# Patient Record
Sex: Female | Born: 1985 | Race: Black or African American | Hispanic: No | Marital: Single | Smoking: Former smoker
Health system: Southern US, Community
[De-identification: ages and names within clinical notes are randomized; demographics above are authoritative.]

## PROBLEM LIST (undated history)

## (undated) DIAGNOSIS — Z789 Other specified health status: Secondary | ICD-10-CM

## (undated) DIAGNOSIS — I82409 Acute embolism and thrombosis of unspecified deep veins of unspecified lower extremity: Secondary | ICD-10-CM

## (undated) HISTORY — PX: NO PAST SURGERIES: SHX2092

---

## 2005-01-25 ENCOUNTER — Emergency Department (HOSPITAL_COMMUNITY): Admission: EM | Admit: 2005-01-25 | Discharge: 2005-01-25 | Payer: Self-pay | Admitting: Emergency Medicine

## 2008-11-14 ENCOUNTER — Emergency Department (HOSPITAL_BASED_OUTPATIENT_CLINIC_OR_DEPARTMENT_OTHER): Admission: EM | Admit: 2008-11-14 | Discharge: 2008-11-14 | Payer: Self-pay | Admitting: Emergency Medicine

## 2011-01-13 ENCOUNTER — Emergency Department (HOSPITAL_BASED_OUTPATIENT_CLINIC_OR_DEPARTMENT_OTHER)
Admission: EM | Admit: 2011-01-13 | Discharge: 2011-01-13 | Disposition: A | Discharge status: Home / Self Care | Payer: Self-pay | Attending: Emergency Medicine | Admitting: Emergency Medicine

## 2011-01-13 ENCOUNTER — Encounter: Payer: Self-pay | Admitting: Emergency Medicine

## 2011-01-13 ENCOUNTER — Emergency Department (INDEPENDENT_AMBULATORY_CARE_PROVIDER_SITE_OTHER): Payer: Self-pay

## 2011-01-13 DIAGNOSIS — X58XXXA Exposure to other specified factors, initial encounter: Secondary | ICD-10-CM | POA: Insufficient documentation

## 2011-01-13 DIAGNOSIS — S76019A Strain of muscle, fascia and tendon of unspecified hip, initial encounter: Secondary | ICD-10-CM

## 2011-01-13 DIAGNOSIS — F17200 Nicotine dependence, unspecified, uncomplicated: Secondary | ICD-10-CM | POA: Insufficient documentation

## 2011-01-13 DIAGNOSIS — IMO0002 Reserved for concepts with insufficient information to code with codable children: Secondary | ICD-10-CM | POA: Insufficient documentation

## 2011-01-13 DIAGNOSIS — M25559 Pain in unspecified hip: Secondary | ICD-10-CM

## 2011-01-13 DIAGNOSIS — Y92009 Unspecified place in unspecified non-institutional (private) residence as the place of occurrence of the external cause: Secondary | ICD-10-CM | POA: Insufficient documentation

## 2011-01-13 MED ORDER — OXYCODONE-ACETAMINOPHEN 5-325 MG PO TABS
2.0000 | ORAL_TABLET | Freq: Once | ORAL | Status: AC
  Administered 2011-01-13: 2 via ORAL
  Filled 2011-01-13: qty 2

## 2011-01-13 NOTE — ED Provider Notes (Signed)
History     Chief Complaint  Patient presents with  . Leg Injury   HPI Comments: PT was turning in bed, felt "pop" to right hip  Patient is a 25 y.o. female presenting with hip pain. The history is provided by the patient.  Hip Pain This is a new problem. The current episode started yesterday. The problem occurs constantly. The problem has been gradually worsening. Pertinent negatives include no abdominal pain. The symptoms are aggravated by twisting. The symptoms are relieved by nothing. She has tried nothing for the symptoms.    History reviewed. No pertinent past medical history.  History reviewed. No pertinent past surgical history.  History reviewed. No pertinent family history.  History  Substance Use Topics  . Smoking status: Current Some Day Smoker  . Smokeless tobacco: Not on file  . Alcohol Use: Yes     socially    OB History    Grav Para Term Preterm Abortions TAB SAB Ect Mult Living                  Review of Systems  Constitutional: Negative for fever.  Gastrointestinal: Negative for nausea, vomiting and abdominal pain.  Musculoskeletal: Negative for back pain, joint swelling and gait problem.  Skin: Negative.     Physical Exam  BP 132/75  Pulse 87  Temp(Src) 98.6 F (37 C) (Oral)  Resp 18  SpO2 100%  LMP 12/20/2010  Physical Exam  Constitutional: She is oriented to person, place, and time. She appears well-developed and well-nourished.  HENT:  Head: Normocephalic.  Eyes: Pupils are equal, round, and reactive to light.  Neck: Neck supple.  Cardiovascular: Normal rate and regular rhythm.   Pulmonary/Chest: Effort normal and breath sounds normal.  Musculoskeletal:       Tenderness on ROM right leg at hip.  No bony tenderness, extending to anterior thigh.  No swelling or deformity noted.  NV intact  Neurological: She is alert and oriented to person, place, and time.    ED Course  Procedures  MDM X-rays negative.  Will tx symptoms, refer to  ortho if no better      Rolan Bucco, MD 01/13/11 1538

## 2011-01-13 NOTE — ED Notes (Signed)
Right thigh injury occurred last night while moving in bed.  Pt states she felt something pop in thigh.  Pt states she is unable to walk on her leg.  Good PMS.

## 2014-07-24 ENCOUNTER — Encounter (HOSPITAL_BASED_OUTPATIENT_CLINIC_OR_DEPARTMENT_OTHER): Payer: Self-pay | Admitting: Emergency Medicine

## 2014-07-24 ENCOUNTER — Emergency Department (HOSPITAL_BASED_OUTPATIENT_CLINIC_OR_DEPARTMENT_OTHER)
Admission: EM | Admit: 2014-07-24 | Discharge: 2014-07-24 | Disposition: A | Discharge status: Home / Self Care | Payer: Self-pay | Attending: Emergency Medicine | Admitting: Emergency Medicine

## 2014-07-24 DIAGNOSIS — A5901 Trichomonal vulvovaginitis: Secondary | ICD-10-CM | POA: Insufficient documentation

## 2014-07-24 DIAGNOSIS — Z720 Tobacco use: Secondary | ICD-10-CM | POA: Insufficient documentation

## 2014-07-24 DIAGNOSIS — Z3202 Encounter for pregnancy test, result negative: Secondary | ICD-10-CM | POA: Insufficient documentation

## 2014-07-24 LAB — URINALYSIS, ROUTINE W REFLEX MICROSCOPIC
BILIRUBIN URINE: NEGATIVE
Glucose, UA: NEGATIVE mg/dL
HGB URINE DIPSTICK: NEGATIVE
KETONES UR: 15 mg/dL — AB
NITRITE: NEGATIVE
Protein, ur: NEGATIVE mg/dL
Specific Gravity, Urine: 1.023 (ref 1.005–1.030)
UROBILINOGEN UA: 1 mg/dL (ref 0.0–1.0)
pH: 6 (ref 5.0–8.0)

## 2014-07-24 LAB — URINE MICROSCOPIC-ADD ON

## 2014-07-24 LAB — WET PREP, GENITAL: YEAST WET PREP: NONE SEEN

## 2014-07-24 LAB — PREGNANCY, URINE: Preg Test, Ur: NEGATIVE

## 2014-07-24 MED ORDER — AZITHROMYCIN 250 MG PO TABS
1000.0000 mg | ORAL_TABLET | Freq: Once | ORAL | Status: AC
  Administered 2014-07-24: 1000 mg via ORAL
  Filled 2014-07-24: qty 4

## 2014-07-24 MED ORDER — METRONIDAZOLE 500 MG PO TABS
2000.0000 mg | ORAL_TABLET | Freq: Once | ORAL | Status: AC
  Administered 2014-07-24: 2000 mg via ORAL
  Filled 2014-07-24: qty 4

## 2014-07-24 MED ORDER — CEFTRIAXONE SODIUM 250 MG IJ SOLR
250.0000 mg | Freq: Once | INTRAMUSCULAR | Status: AC
  Administered 2014-07-24: 250 mg via INTRAMUSCULAR
  Filled 2014-07-24: qty 250

## 2014-07-24 MED ORDER — LIDOCAINE HCL 2 % IJ SOLN
5.0000 mL | Freq: Once | INTRAMUSCULAR | Status: AC
  Administered 2014-07-24: 2 mL
  Filled 2014-07-24: qty 20

## 2014-07-24 NOTE — ED Notes (Signed)
Pelvic cart at Pts room 

## 2014-07-24 NOTE — Discharge Instructions (Signed)
Trichomoniasis °Trichomoniasis is an infection caused by an organism called Trichomonas. The infection can affect both women and men. In women, the outer female genitalia and the vagina are affected. In men, the penis is mainly affected, but the prostate and other reproductive organs can also be involved. Trichomoniasis is a sexually transmitted infection (STI) and is most often passed to another person through sexual contact.  °RISK FACTORS °· Having unprotected sexual intercourse. °· Having sexual intercourse with an infected partner. °SIGNS AND SYMPTOMS  °Symptoms of trichomoniasis in women include: °· Abnormal gray-green frothy vaginal discharge. °· Itching and irritation of the vagina. °· Itching and irritation of the area outside the vagina. °Symptoms of trichomoniasis in men include:  °· Penile discharge with or without pain. °· Pain during urination. This results from inflammation of the urethra. °DIAGNOSIS  °Trichomoniasis may be found during a Pap test or physical exam. Your health care provider may use one of the following methods to help diagnose this infection: °· Examining vaginal discharge under a microscope. For men, urethral discharge would be examined. °· Testing the pH of the vagina with a test tape. °· Using a vaginal swab test that checks for the Trichomonas organism. A test is available that provides results within a few minutes. °· Doing a culture test for the organism. This is not usually needed. °TREATMENT  °· You may be given medicine to fight the infection. Women should inform their health care provider if they could be or are pregnant. Some medicines used to treat the infection should not be taken during pregnancy. °· Your health care provider may recommend over-the-counter medicines or creams to decrease itching or irritation. °· Your sexual partner will need to be treated if infected. °HOME CARE INSTRUCTIONS  °· Take medicines only as directed by your health care provider. °· Take  over-the-counter medicine for itching or irritation as directed by your health care provider. °· Do not have sexual intercourse while you have the infection. °· Women should not douche or wear tampons while they have the infection. °· Discuss your infection with your partner. Your partner may have gotten the infection from you, or you may have gotten it from your partner. °· Have your sex partner get examined and treated if necessary. °· Practice safe, informed, and protected sex. °· See your health care provider for other STI testing. °SEEK MEDICAL CARE IF:  °· You still have symptoms after you finish your medicine. °· You develop abdominal pain. °· You have pain when you urinate. °· You have bleeding after sexual intercourse. °· You develop a rash. °· Your medicine makes you sick or makes you throw up (vomit). °MAKE SURE YOU: °· Understand these instructions. °· Will watch your condition. °· Will get help right away if you are not doing well or get worse. °Document Released: 11/28/2000 Document Revised: 10/19/2013 Document Reviewed: 03/16/2013 °ExitCare® Patient Information ©2015 [employer]. This information is not intended to replace advice given to you by your health care provider. Make sure you discuss any questions you have with your health care provider. ° °Sexually Transmitted Disease °A sexually transmitted disease (STD) is a disease or infection that may be passed (transmitted) from person to person, usually during sexual activity. This may happen by way of saliva, semen, blood, vaginal mucus, or urine. Common STDs include:  °· Gonorrhea.   °· Chlamydia.   °· Syphilis.   °· HIV and AIDS.   °· Genital herpes.   °· Hepatitis B and C.   °· Trichomonas.   °· Human papillomavirus (  HPV).   °· Pubic lice.   °· Scabies. °· Mites. °· Bacterial vaginosis. °WHAT ARE CAUSES OF STDs? °An STD may be caused by bacteria, a virus, or parasites. STDs are often transmitted during sexual activity if one person is infected.  However, they may also be transmitted through nonsexual means. STDs may be transmitted after:  °· Sexual intercourse with an infected person.   °· Sharing sex toys with an infected person.   °· Sharing needles with an infected person or using unclean piercing or tattoo needles. °· Having intimate contact with the genitals, mouth, or rectal areas of an infected person.   °· Exposure to infected fluids during birth. °WHAT ARE THE SIGNS AND SYMPTOMS OF STDs? °Different STDs have different symptoms. Some people may not have any symptoms. If symptoms are present, they may include:  °· Painful or bloody urination.   °· Pain in the pelvis, abdomen, vagina, anus, throat, or eyes.   °· A skin rash, itching, or irritation. °· Growths, ulcerations, blisters, or sores in the genital and anal areas. °· Abnormal vaginal discharge with or without bad odor.   °· Penile discharge in men.   °· Fever.   °· Pain or bleeding during sexual intercourse.   °· Swollen glands in the groin area.   °· Yellow skin and eyes (jaundice). This is seen with hepatitis.   °· Swollen testicles. °· Infertility. °· Sores and blisters in the mouth. °HOW ARE STDs DIAGNOSED? °To make a diagnosis, your health care provider may:  °· Take a medical history.   °· Perform a physical exam.   °· Take a sample of any discharge to examine. °· Swab the throat, cervix, opening to the penis, rectum, or vagina for testing. °· Test a sample of your first morning urine.   °· Perform blood tests.   °· Perform a Pap test, if this applies.   °· Perform a colposcopy.   °· Perform a laparoscopy.   °HOW ARE STDs TREATED? ° Treatment depends on the STD. Some STDs may be treated but not cured.  °· Chlamydia, gonorrhea, trichomonas, and syphilis can be cured with antibiotic medicine.   °· Genital herpes, hepatitis, and HIV can be treated, but not cured, with prescribed medicines. The medicines lessen symptoms.   °· Genital warts from HPV can be treated with medicine or by  freezing, burning (electrocautery), or surgery. Warts may come back.   °· HPV cannot be cured with medicine or surgery. However, abnormal areas may be removed from the cervix, vagina, or vulva.   °· If your diagnosis is confirmed, your recent sexual partners need treatment. This is true even if they are symptom-free or have a negative culture or evaluation. They should not have sex until their health care providers say it is okay. °HOW CAN I REDUCE MY RISK OF GETTING AN STD? °Take these steps to reduce your risk of getting an STD: °· Use latex condoms, dental dams, and water-soluble lubricants during sexual activity. Do not use petroleum jelly or oils. °· Avoid having multiple sex partners. °· Do not have sex with someone who has other sex partners. °· Do not have sex with anyone you do not know or who is at high risk for an STD. °· Avoid risky sex practices that can break your skin. °· Do not have sex if you have open sores on your mouth or skin. °· Avoid drinking too much alcohol or taking illegal drugs. Alcohol and drugs can affect your judgment and put you in a vulnerable position. °· Avoid engaging in oral and anal sex acts. °· Get vaccinated for HPV and hepatitis. If you   have not received these vaccines in the past, talk to your health care provider about whether one or both might be right for you.   °· If you are at risk of being infected with HIV, it is recommended that you take a prescription medicine daily to prevent HIV infection. This is called pre-exposure prophylaxis (PrEP). You are considered at risk if: °¨ You are a man who has sex with other men (MSM). °¨ You are a heterosexual man or woman and are sexually active with more than one partner. °¨ You take drugs by injection. °¨ You are sexually active with a partner who has HIV. °· Talk with your health care provider about whether you are at high risk of being infected with HIV. If you choose to begin PrEP, you should first be tested for HIV. You  should then be tested every 3 months for as long as you are taking PrEP.   °WHAT SHOULD I DO IF I THINK I HAVE AN STD? °· See your health care provider.   °· Tell your sexual partner(s). They should be tested and treated for any STDs. °· Do not have sex until your health care provider says it is okay.  °WHEN SHOULD I GET IMMEDIATE MEDICAL CARE? °Contact your health care provider right away if:  °· You have severe abdominal pain. °· You are a man and notice swelling or pain in your testicles. °· You are a woman and notice swelling or pain in your vagina. °Document Released: 08/25/2002 Document Revised: 06/09/2013 Document Reviewed: 12/23/2012 °ExitCare® Patient Information ©2015 [employer]. This information is not intended to replace advice given to you by your health care provider. Make sure you discuss any questions you have with your health care provider. ° °

## 2014-07-24 NOTE — ED Notes (Signed)
Pt reports vaginal irritation since wed, denies discharge, found out partner was sleeping with other people

## 2014-07-24 NOTE — ED Provider Notes (Signed)
TIME SEEN: 2:05 AM  CHIEF COMPLAINT: vaginal irritation  HPI: Pt is a 29 y.o. G0 P0 who presents to the emergency department with complaints of vaginal irritation with the past 2 days. Denies discharge or bleeding. States her last menstrual period was the third week of January. No dysuria or hematuria. Is sexually active with one partner but did recently find out that he cheated on her. Denies a prior history of STDs.  ROS: See HPI Constitutional: no fever  Eyes: no drainage  ENT: no runny nose   Cardiovascular:  no chest pain  Resp: no SOB  GI: no vomiting GU: no dysuria Integumentary: no rash  Allergy: no hives  Musculoskeletal: no leg swelling  Neurological: no slurred speech ROS otherwise negative  PAST MEDICAL HISTORY/PAST SURGICAL HISTORY:  History reviewed. No pertinent past medical history.  MEDICATIONS:  Prior to Admission medications   Not on File    ALLERGIES:  No Known Allergies  SOCIAL HISTORY:  History  Substance Use Topics  . Smoking status: Current Some Day Smoker  . Smokeless tobacco: Not on file  . Alcohol Use: Yes     Comment: socially    FAMILY HISTORY: History reviewed. No pertinent family history.  EXAM: BP 156/58 mmHg  Pulse 88  Temp(Src) 98.6 F (37 C) (Oral)  Resp 18  Ht 5\' 10"  (1.778 m)  Wt 280 lb (127.007 kg)  BMI 40.18 kg/m2  SpO2 100%  LMP 07/07/2014 CONSTITUTIONAL: Alert and oriented and responds appropriately to questions. Well-appearing; well-nourished HEAD: Normocephalic EYES: Conjunctivae clear, PERRL ENT: normal nose; no rhinorrhea; moist mucous membranes; pharynx without lesions noted NECK: Supple, no meningismus, no LAD  CARD: RRR; S1 and S2 appreciated; no murmurs, no clicks, no rubs, no gallops RESP: Normal chest excursion without splinting or tachypnea; breath sounds clear and equal bilaterally; no wheezes, no rhonchi, no rales ABD/GI: Normal bowel sounds; non-distended; soft, non-tender, no rebound, no guarding GU:    Minimal amount of thin [place] vaginal discharge, no vaginal bleeding, no adnexal tenderness or fullness, no cervical motion tenderness, normal external genitalia BACK:  The back appears normal and is non-tender to palpation, there is no CVA tenderness EXT: Normal ROM in all joints; non-tender to palpation; no edema; normal capillary refill; no cyanosis    SKIN: Normal color for age and race; warm NEURO: Moves all extremities equally PSYCH: The patient's mood and manner are appropriate. Grooming and personal hygiene are appropriate.  MEDICAL DECISION MAKING: Pt here with concerns for STD exposure with vaginal irritation. Wet prep is positive for trichomonas. Well-appearing, or for gonorrhea and chlamydia as well as. Will treat with Flagyl, azithromycin, ceftriaxone. Have recommended HIV and syphilis testing which patient refuses today. Have advised her to follow-up with her OB/GYN or the health department. Have also discussed with patient that if she is positive for STDs that all of her sexual partners will need to be tested and treated as well and she will need to avoid sexual activity until her sexual partners have been treated. Have discussed strict return precautions. No cervical motion tenderness on exam to suggest PID. She is very well-appearing.  Urine shows leukocytes but also bacteria and squamous cells. No other sign of a urinary infection. Urine pregnancy is negative. Patient comfortable with plan for discharge home. Will give outpatient OB/GYN follow-up information.      Layla MawKristen N Myron Stankovich, DO 07/24/14 [phone]

## 2014-07-26 LAB — GC/CHLAMYDIA PROBE AMP (~~LOC~~) NOT AT ARMC
CHLAMYDIA, DNA PROBE: POSITIVE — AB
Neisseria Gonorrhea: NEGATIVE

## 2014-07-28 ENCOUNTER — Telehealth (HOSPITAL_BASED_OUTPATIENT_CLINIC_OR_DEPARTMENT_OTHER): Payer: Self-pay | Admitting: Emergency Medicine

## 2014-07-28 NOTE — Telephone Encounter (Signed)
+  chlamydia, did receive treatment in the ED, lvm for callback for notification

## 2014-11-26 ENCOUNTER — Emergency Department (HOSPITAL_BASED_OUTPATIENT_CLINIC_OR_DEPARTMENT_OTHER)
Admission: EM | Admit: 2014-11-26 | Discharge: 2014-11-26 | Disposition: A | Discharge status: Home / Self Care | Payer: Medicaid Other | Attending: Emergency Medicine | Admitting: Emergency Medicine

## 2014-11-26 ENCOUNTER — Encounter (HOSPITAL_BASED_OUTPATIENT_CLINIC_OR_DEPARTMENT_OTHER): Payer: Self-pay

## 2014-11-26 DIAGNOSIS — Z3201 Encounter for pregnancy test, result positive: Secondary | ICD-10-CM | POA: Insufficient documentation

## 2014-11-26 DIAGNOSIS — Z3490 Encounter for supervision of normal pregnancy, unspecified, unspecified trimester: Secondary | ICD-10-CM

## 2014-11-26 DIAGNOSIS — Z3A00 Weeks of gestation of pregnancy not specified: Secondary | ICD-10-CM | POA: Diagnosis not present

## 2014-11-26 DIAGNOSIS — O99331 Smoking (tobacco) complicating pregnancy, first trimester: Secondary | ICD-10-CM | POA: Insufficient documentation

## 2014-11-26 DIAGNOSIS — Z3200 Encounter for pregnancy test, result unknown: Secondary | ICD-10-CM | POA: Diagnosis present

## 2014-11-26 DIAGNOSIS — F17210 Nicotine dependence, cigarettes, uncomplicated: Secondary | ICD-10-CM | POA: Insufficient documentation

## 2014-11-26 LAB — URINALYSIS, ROUTINE W REFLEX MICROSCOPIC
BILIRUBIN URINE: NEGATIVE
Glucose, UA: NEGATIVE mg/dL
HGB URINE DIPSTICK: NEGATIVE
KETONES UR: NEGATIVE mg/dL
NITRITE: NEGATIVE
PROTEIN: NEGATIVE mg/dL
Specific Gravity, Urine: 1.044 — ABNORMAL HIGH (ref 1.005–1.030)
Urobilinogen, UA: 0.2 mg/dL (ref 0.0–1.0)
pH: 6 (ref 5.0–8.0)

## 2014-11-26 LAB — URINE MICROSCOPIC-ADD ON

## 2014-11-26 LAB — PREGNANCY, URINE: Preg Test, Ur: POSITIVE — AB

## 2014-11-26 MED ORDER — PRENATAL COMPLETE 14-0.4 MG PO TABS: 1.0000 | ORAL_TABLET | Freq: Every day | ORAL | Status: DC

## 2014-11-26 MED ORDER — NITROFURANTOIN MONOHYD MACRO 100 MG PO CAPS: 100.0000 mg | ORAL_CAPSULE | Freq: Two times a day (BID) | ORAL | Status: DC

## 2014-11-26 NOTE — ED Provider Notes (Signed)
CSN: [id number]     Arrival date & time 11/26/14  1242 History   First MD Initiated Contact with Patient 11/26/14 1351     Chief Complaint  Patient presents with  . Possible Pregnancy     (Consider location/radiation/quality/duration/timing/severity/associated sxs/prior Treatment) HPI Norma Lin is a 29 y.o. female with no medical problems, presents to ED wanting pregnancy test. States her last menstrual period was mid April. States she missed a period in May, states that she normally has very regular periods. States she became concerned and took two pregnancy test. States that one came back positive and one negative. Pt states she did have some mild abdominal cramping before, states felt like mild menstrual cramps, but subsided and at present she has no pain. States she is not having any vaginal discharge. No vaginal bleeding. No urinary symptoms. This is her first pregnancy.   History reviewed. No pertinent past medical history. History reviewed. No pertinent past surgical history. No family history on file. History  Substance Use Topics  . Smoking status: Current Some Day Smoker  . Smokeless tobacco: Not on file  . Alcohol Use: Yes     Comment: socially   OB History    No data available     Review of Systems  Constitutional: Negative for fever and chills.  Respiratory: Negative for cough, chest tightness and shortness of breath.   Cardiovascular: Negative for chest pain, palpitations and leg swelling.  Gastrointestinal: Negative for nausea, vomiting, abdominal pain and diarrhea.  Genitourinary: Negative for dysuria, flank pain, vaginal bleeding, vaginal discharge, vaginal pain and pelvic pain.  Musculoskeletal: Negative for myalgias, arthralgias, neck pain and neck stiffness.  Skin: Negative for rash.  Neurological: Negative for dizziness, weakness and headaches.  All other systems reviewed and are negative.     Allergies  Review of patient's allergies indicates no known  allergies.  Home Medications   Prior to Admission medications   Not on File   BP 135/82 mmHg  Pulse 78  Temp(Src) 98.4 F (36.9 C) (Oral)  Resp 18  Ht  (1.753 m)  Wt 270 lb (122.471 kg)  BMI 39.85 kg/m2  SpO2 98%  LMP 10/01/2014 (Approximate) Physical Exam  Constitutional: She is oriented to person, place, and time. She appears well-developed and well-nourished. No distress.  HENT:  Head: Normocephalic.  Eyes: Conjunctivae are normal.  Neck: Neck supple.  Cardiovascular: Normal rate, regular rhythm and normal heart sounds.   Pulmonary/Chest: Effort normal and breath sounds normal. No respiratory distress. She has no wheezes. She has no rales.  Abdominal: Soft. Bowel sounds are normal. She exhibits no distension. There is no tenderness. There is no rebound.  Musculoskeletal: She exhibits no edema.  Neurological: She is alert and oriented to person, place, and time.  Skin: Skin is warm and dry.  Psychiatric: She has a normal mood and affect. Her behavior is normal.  Nursing note and vitals reviewed.   ED Course  Procedures (including critical care time) Labs Review Labs Reviewed  PREGNANCY, URINE - Abnormal; Notable for the following:    Preg Test, Ur POSITIVE (*)    All other components within normal limits  URINALYSIS, ROUTINE W REFLEX MICROSCOPIC (NOT AT [facility]) - Abnormal; Notable for the following:    APPearance CLOUDY (*)    Specific Gravity, Urine 1.044 (*)    Leukocytes, UA SMALL (*)    All other components within normal limits  URINE MICROSCOPIC-ADD ON - Abnormal; Notable for the following:    Squamous  Epithelial / LPF MANY (*)    Bacteria, UA MANY (*)    All other components within normal limits    Imaging Review No results found.   EKG Interpretation None      MDM   Final diagnoses:  Pregnancy    Pt is here for pregnancy test. She has no other complaints, specifically no abdominal pain, no vaginal discharge or bleeding. Her VS are normal.  Pt does not take any medications. WIll start on prenatal vitamins, tylenol for any pain symptoms. Advised to stop smoking. No alcohol. Follow up with OB/GYN of her choice. Return precautions discussed. [facility] any abdominal pain, discharge, bleeding, any new concerning symptom.   Filed Vitals:   11/26/14 1254 11/26/14 1424  BP: 135/82 140/72  Pulse: 78 80  Temp: 98.4 F (36.9 C)   TempSrc: Oral   Resp: 18 18  Height: 5\' 9"  (1.753 m)   Weight: 270 lb (122.471 kg)   SpO2: 98% 100%       Jaynie Crumble, PA-C 11/26/14 2236  Rolan Bucco, MD 11/28/14 [phone]

## 2014-11-26 NOTE — ED Notes (Addendum)
Pt states she took 2 home pregnancy test-one pos, one neg-LMP period "middle of April"-abd cramping x 1 month-denies vaginal bleeding/discharge

## 2014-11-26 NOTE — Discharge Instructions (Signed)
Your pregnancy test here is positive. Take prenatal vitamins daily. Macrobid for possible uti until all gone. Please follow up with OB/GYN of your choice or womens hospital clinic. Return or go to [facility] if any abdominal pain, vaginal bleeding, fever, any new concerning symptoms.    First Trimester of Pregnancy The first trimester of pregnancy is from week 1 until the end of week 12 (months 1 through 3). A week after a sperm fertilizes an egg, the egg will implant on the wall of the uterus. This embryo will begin to develop into a baby. Genes from you and your partner are forming the baby. The female genes determine whether the baby is a boy or a girl. At 6-8 weeks, the eyes and face are formed, and the heartbeat can be seen on ultrasound. At the end of 12 weeks, all the baby's organs are formed.  Now that you are pregnant, you will want to do everything you can to have a healthy baby. Two of the most important things are to get good prenatal care and to follow your health care provider's instructions. Prenatal care is all the medical care you receive before the baby's birth. This care will help prevent, find, and treat any problems during the pregnancy and childbirth. BODY CHANGES Your body goes through many changes during pregnancy. The changes vary from woman to woman.   You may gain or lose a couple of pounds at first.  You may feel sick to your stomach (nauseous) and throw up (vomit). If the vomiting is uncontrollable, call your health care provider.  You may tire easily.  You may develop headaches that can be relieved by medicines approved by your health care provider.  You may urinate more often. Painful urination may mean you have a bladder infection.  You may develop heartburn as a result of your pregnancy.  You may develop constipation because certain hormones are causing the muscles that push waste through your intestines to slow down.  You may develop hemorrhoids or swollen,  bulging veins (varicose veins).  Your breasts may begin to grow larger and become tender. Your nipples may stick out more, and the tissue that surrounds them (areola) may become darker.  Your gums may bleed and may be sensitive to brushing and flossing.  Dark spots or blotches (chloasma, mask of pregnancy) may develop on your face. This will likely fade after the baby is born.  Your menstrual periods will stop.  You may have a loss of appetite.  You may develop cravings for certain kinds of food.  You may have changes in your emotions from day to day, such as being excited to be pregnant or being concerned that something may go wrong with the pregnancy and baby.  You may have more vivid and strange dreams.  You may have changes in your hair. These can include thickening of your hair, rapid growth, and changes in texture. Some women also have hair loss during or after pregnancy, or hair that feels dry or thin. Your hair will most likely return to normal after your baby is born. WHAT TO EXPECT AT YOUR PRENATAL VISITS During a routine prenatal visit:  You will be weighed to make sure you and the baby are growing normally.  Your blood pressure will be taken.  Your abdomen will be measured to track your baby's growth.  The fetal heartbeat will be listened to starting around week 10 or 12 of your pregnancy.  Test results from any previous visits  will be discussed. Your health care provider may ask you:  How you are feeling.  If you are feeling the baby move.  If you have had any abnormal symptoms, such as leaking fluid, bleeding, severe headaches, or abdominal cramping.  If you have any questions. Other tests that may be performed during your first trimester include:  Blood tests to find your blood type and to check for the presence of any previous infections. They will also be used to check for low iron levels (anemia) and Rh antibodies. Later in the pregnancy, blood tests for  diabetes will be done along with other tests if problems develop.  Urine tests to check for infections, diabetes, or protein in the urine.  An ultrasound to confirm the proper growth and development of the baby.  An amniocentesis to check for possible genetic problems.  Fetal screens for spina bifida and Down syndrome.  You may need other tests to make sure you and the baby are doing well. HOME CARE INSTRUCTIONS  Medicines  Follow your health care provider's instructions regarding medicine use. Specific medicines may be either safe or unsafe to take during pregnancy.  Take your prenatal vitamins as directed.  If you develop constipation, try taking a stool softener if your health care provider approves. Diet  Eat regular, well-balanced meals. Choose a variety of foods, such as meat or vegetable-based protein, fish, milk and low-fat dairy products, vegetables, fruits, and whole grain breads and cereals. Your health care provider will help you determine the amount of weight gain that is right for you.  Avoid raw meat and uncooked cheese. These carry germs that can cause birth defects in the baby.  Eating four or five small meals rather than three large meals a day may help relieve nausea and vomiting. If you start to feel nauseous, eating a few soda crackers can be helpful. Drinking liquids between meals instead of during meals also seems to help nausea and vomiting.  If you develop constipation, eat more high-fiber foods, such as fresh vegetables or fruit and whole grains. Drink enough fluids to keep your urine clear or pale yellow. Activity and Exercise  Exercise only as directed by your health care provider. Exercising will help you:  Control your weight.  Stay in shape.  Be prepared for labor and delivery.  Experiencing pain or cramping in the lower abdomen or low back is a good sign that you should stop exercising. Check with your health care provider before continuing normal  exercises.  Try to avoid standing for long periods of time. Move your legs often if you must stand in one place for a long time.  Avoid heavy lifting.  Wear low-heeled shoes, and practice good posture.  You may continue to have sex unless your health care provider directs you otherwise. Relief of Pain or Discomfort  Wear a good support bra for breast tenderness.   Take warm sitz baths to soothe any pain or discomfort caused by hemorrhoids. Use hemorrhoid cream if your health care provider approves.   Rest with your legs elevated if you have leg cramps or low back pain.  If you develop varicose veins in your legs, wear support hose. Elevate your feet for 15 minutes, 3-4 times a day. Limit salt in your diet. Prenatal Care  Schedule your prenatal visits by the twelfth week of pregnancy. They are usually scheduled monthly at first, then more often in the last 2 months before delivery.  Write down your questions. Take them to  your prenatal visits.  Keep all your prenatal visits as directed by your health care provider. Safety  Wear your seat belt at all times when driving.  Make a list of emergency phone numbers, including numbers for family, friends, the hospital, and police and fire departments. General Tips  Ask your health care provider for a referral to a local prenatal education class. Begin classes no later than at the beginning of month 6 of your pregnancy.  Ask for help if you have counseling or nutritional needs during pregnancy. Your health care provider can offer advice or refer you to specialists for help with various needs.  Do not use hot tubs, steam rooms, or saunas.  Do not douche or use tampons or scented sanitary pads.  Do not cross your legs for long periods of time.  Avoid cat litter boxes and soil used by cats. These carry germs that can cause birth defects in the baby and possibly loss of the fetus by miscarriage or stillbirth.  Avoid all smoking,  herbs, alcohol, and medicines not prescribed by your health care provider. Chemicals in these affect the formation and growth of the baby.  Schedule a dentist appointment. At home, brush your teeth with a soft toothbrush and be gentle when you floss. SEEK MEDICAL CARE IF:   You have dizziness.  You have mild pelvic cramps, pelvic pressure, or nagging pain in the abdominal area.  You have persistent nausea, vomiting, or diarrhea.  You have a bad smelling vaginal discharge.  You have pain with urination.  You notice increased swelling in your face, hands, legs, or ankles. SEEK IMMEDIATE MEDICAL CARE IF:   You have a fever.  You are leaking fluid from your vagina.  You have spotting or bleeding from your vagina.  You have severe abdominal cramping or pain.  You have rapid weight gain or loss.  You vomit blood or material that looks like coffee grounds.  You are exposed to [place] measles and have never had them.  You are exposed to fifth disease or chickenpox.  You develop a severe headache.  You have shortness of breath.  You have any kind of trauma, such as from a fall or a car accident. Document Released: 05/29/2001 Document Revised: 10/19/2013 Document Reviewed: 04/14/2013 [facility] Patient Information 2015 [place], [place]. This information is not intended to replace advice given to you by your health care provider. Make sure you discuss any questions you have with your health care provider.

## 2015-02-11 ENCOUNTER — Encounter (HOSPITAL_COMMUNITY): Payer: Self-pay

## 2015-02-11 ENCOUNTER — Ambulatory Visit (HOSPITAL_COMMUNITY)
Admission: RE | Admit: 2015-02-11 | Discharge: 2015-02-11 | Disposition: A | Discharge status: Home / Self Care | Payer: Medicaid Other | Source: Ambulatory Visit | Attending: Specialist | Admitting: Specialist

## 2015-02-11 VITALS — BP 127/88 | HR 92 | Wt 293.4 lb

## 2015-02-11 DIAGNOSIS — O9989 Other specified diseases and conditions complicating pregnancy, childbirth and the puerperium: Secondary | ICD-10-CM | POA: Insufficient documentation

## 2015-02-11 DIAGNOSIS — Z823 Family history of stroke: Secondary | ICD-10-CM | POA: Insufficient documentation

## 2015-02-11 DIAGNOSIS — R7989 Other specified abnormal findings of blood chemistry: Secondary | ICD-10-CM | POA: Diagnosis present

## 2015-02-11 DIAGNOSIS — Z3A16 16 weeks gestation of pregnancy: Secondary | ICD-10-CM | POA: Insufficient documentation

## 2015-02-11 DIAGNOSIS — Z3689 Encounter for other specified antenatal screening: Secondary | ICD-10-CM

## 2015-02-11 HISTORY — DX: Other specified health status: Z78.9

## 2015-02-11 NOTE — Consult Note (Signed)
Maternal Fetal Medicine Consultation  Requesting Provider(s): Arther Abbott, MD  Reason for consultation: Minimally elevated LFTs, strong family history of preeclampsia  HPI: Norma Lin is a 29 yo G1P0, EDD 07/26/2015 who is currently at 16w 3d who was seen for consultation due to a strong family history of preeclampsia and minimally elevated LFTs on baseline lab work.  Norma Lin reports that her mother had preeclampsia with every pregnancy, but that she delivered all her children after 37 weeks.  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  Her mother previously had a CVA and her maternal grandmother had an aneurysm.  The patient herself does not have a history of chronic hypertension.  Norma Lin reports that she has had some lower extremity swelling but is otherwise asymptomatic.  Her baseline 12-hr urine was 143 mg/12 hrs (~ 286 mg/24 hrs) and her baseline preeclampsia labs were normal except for an AST of 31 u/l (normal 10-30) and an ALT of 43 U/L (normal 6-29).  The patient reports a remote history of gall stones but has not had a recent evaluation.  She is otherwise without complaints.  OB History: OB History    Gravida Para Term Preterm AB TAB SAB Ectopic Multiple Living   1               PMH:  Past Medical History  Diagnosis Date  . Medical history non-contributory     PSH:  Past Surgical History  Procedure Laterality Date  . No past surgeries     Meds: Prenatal vitamins  Allergies: No Known Allergies  FH: denies birth defects or hereditary disorders.  See HPI  Soc: No tobacco use, ETOH or drug use since becoming pregnant.  Review of Systems: no vaginal bleeding or cramping/contractions, no LOF, no nausea/vomiting. All other systems reviewed and are negative.  PE:   Filed Vitals:   02/11/15 1447  BP: 127/88  Pulse: 92   A/P: 1) Single IUP at 16w 3d  2) Minimally elevated LFTs - The patient's mother reports a  history of preeclampsia with each pregnancy, but none that resulted in preterm delivery.  Her baseline preeclampsia labs showed a 12-hr urine of 143 mg and minimally elevated AST of 31 (nl 10-30) and an ALT of 43 (nl 6-29).  Her clinic blood pressures today are within normal limits.  Early onset preeclampsia before [redacted] weeks gestation is extremely unusual.  The patient reports a personal history of cholelithiasis which would seem much more plausible as an explanation for her minimally elevated LFTs.  Recommendations: 1) Consider either a full 24-hr urine protein collection or a urine protein/creatine ratio for baseline evaluation 2) Would repeat LFTS in 2-4 weeks to trend.  Would recommend a RUQ ultrasound to evaluate possible cholelithiasis. Consider Hepatitis A, B and C testing.  If LFTs are trending upward, would consider GI referral if no other source is identified. 3) Ultrasound at 18 weeks for anatomy (scheduled) - if this is indeed early preeclampsia, would expect some element of early growth restriction, even at this gestational age. 4) Consider daily baby Aspirin  - the [place] preventative task force recommendations include daily baby aspirin after [redacted] weeks gestation for women with a history of preeclampsia requiring delivery prior to 30 weeks or recurrent episode of preeclampsia at term.  While the patient does not exactly fit into this category, feel that the risk of daily baby aspirin is extremely low and there may be some benefit for her given her strong  family history.   Thank you for the opportunity to be a part of the care of Norma Lin. Please contact our office if we can be of further assistance.   I spent approximately 30 minutes with this patient with over 50% of time spent in face-to-face counseling.  Alpha Gula, MD Maternal Fetal Medicine

## 2015-02-14 ENCOUNTER — Other Ambulatory Visit (HOSPITAL_COMMUNITY): Payer: Self-pay | Admitting: Specialist

## 2015-02-25 ENCOUNTER — Ambulatory Visit (HOSPITAL_COMMUNITY)
Admission: RE | Admit: 2015-02-25 | Discharge: 2015-02-25 | Disposition: A | Discharge status: Home / Self Care | Payer: Medicaid Other | Source: Ambulatory Visit | Attending: Specialist | Admitting: Specialist

## 2015-02-25 ENCOUNTER — Other Ambulatory Visit (HOSPITAL_COMMUNITY): Payer: Self-pay | Admitting: Maternal and Fetal Medicine

## 2015-02-25 ENCOUNTER — Encounter (HOSPITAL_COMMUNITY): Payer: Self-pay

## 2015-02-25 DIAGNOSIS — O99332 Smoking (tobacco) complicating pregnancy, second trimester: Secondary | ICD-10-CM

## 2015-02-25 DIAGNOSIS — Z3689 Encounter for other specified antenatal screening: Secondary | ICD-10-CM

## 2015-02-25 DIAGNOSIS — Z3A21 21 weeks gestation of pregnancy: Secondary | ICD-10-CM | POA: Insufficient documentation

## 2015-02-25 DIAGNOSIS — O99212 Obesity complicating pregnancy, second trimester: Secondary | ICD-10-CM | POA: Diagnosis present

## 2015-02-25 DIAGNOSIS — Z36 Encounter for antenatal screening of mother: Secondary | ICD-10-CM | POA: Diagnosis not present

## 2015-02-25 DIAGNOSIS — E669 Obesity, unspecified: Secondary | ICD-10-CM | POA: Diagnosis not present

## 2015-03-25 ENCOUNTER — Ambulatory Visit (HOSPITAL_COMMUNITY): Admission: RE | Admit: 2015-03-25 | Payer: Medicaid Other | Source: Ambulatory Visit

## 2015-03-27 ENCOUNTER — Encounter (HOSPITAL_BASED_OUTPATIENT_CLINIC_OR_DEPARTMENT_OTHER): Payer: Self-pay | Admitting: Emergency Medicine

## 2015-03-27 ENCOUNTER — Emergency Department (HOSPITAL_BASED_OUTPATIENT_CLINIC_OR_DEPARTMENT_OTHER)
Admission: EM | Admit: 2015-03-27 | Discharge: 2015-03-27 | Disposition: A | Discharge status: Home / Self Care | Payer: Medicaid Other | Attending: Emergency Medicine | Admitting: Emergency Medicine

## 2015-03-27 DIAGNOSIS — B9689 Other specified bacterial agents as the cause of diseases classified elsewhere: Secondary | ICD-10-CM

## 2015-03-27 DIAGNOSIS — O23592 Infection of other part of genital tract in pregnancy, second trimester: Secondary | ICD-10-CM | POA: Diagnosis not present

## 2015-03-27 DIAGNOSIS — Z87891 Personal history of nicotine dependence: Secondary | ICD-10-CM | POA: Diagnosis not present

## 2015-03-27 DIAGNOSIS — Z3A25 25 weeks gestation of pregnancy: Secondary | ICD-10-CM | POA: Diagnosis not present

## 2015-03-27 DIAGNOSIS — Z79899 Other long term (current) drug therapy: Secondary | ICD-10-CM | POA: Insufficient documentation

## 2015-03-27 DIAGNOSIS — N760 Acute vaginitis: Secondary | ICD-10-CM

## 2015-03-27 LAB — WET PREP, GENITAL
Trich, Wet Prep: NONE SEEN
Yeast Wet Prep HPF POC: NONE SEEN

## 2015-03-27 LAB — URINALYSIS, ROUTINE W REFLEX MICROSCOPIC
Bilirubin Urine: NEGATIVE
Glucose, UA: NEGATIVE mg/dL
Hgb urine dipstick: NEGATIVE
Ketones, ur: NEGATIVE mg/dL
Leukocytes, UA: NEGATIVE
Nitrite: NEGATIVE
Protein, ur: NEGATIVE mg/dL
Specific Gravity, Urine: 1.027 (ref 1.005–1.030)
Urobilinogen, UA: 0.2 mg/dL (ref 0.0–1.0)
pH: 6.5 (ref 5.0–8.0)

## 2015-03-27 MED ORDER — METRONIDAZOLE 500 MG PO TABS: 500.0000 mg | ORAL_TABLET | Freq: Two times a day (BID) | ORAL | Status: DC

## 2015-03-27 NOTE — ED Notes (Signed)
Pt c/o Norma Lin vaginal discharge. Pt is 6 months pregnant

## 2015-03-27 NOTE — ED Provider Notes (Signed)
CSN: [id number]     Arrival date & time 03/27/15  1543 History  By signing my name below, I, Budd Palmer, attest that this documentation has been prepared under the direction and in the presence of Raeford Razor, MD. Electronically Signed: Budd Palmer, ED Scribe. 03/27/2015. 4:08 PM.    Chief Complaint  Patient presents with  . Vaginal Discharge   The history is provided by the patient. No language interpreter was used.   HPI Comments: Norma Lin is a 29 y.o. female who is 6 months pregnant and presents to the Emergency Department complaining of [facility], odorless vaginal discharge onset 2 weeks ago. She states she is still sexually active with the child's father. She notes a PMHx of STDs. Pt denies abdominal pain and vaginal pain or bleeding.  Past Medical History  Diagnosis Date  . Medical history non-contributory    Past Surgical History  Procedure Laterality Date  . No past surgeries     No family history on file. Social History  Substance Use Topics  . Smoking status: Former Games developer  . Smokeless tobacco: None  . Alcohol Use: Yes     Comment: none with pregnancy   OB History    Gravida Para Term Preterm AB TAB SAB Ectopic Multiple Living   1              Review of Systems  Gastrointestinal: Negative for abdominal pain.  Genitourinary: Positive for vaginal discharge. Negative for vaginal bleeding and vaginal pain.  All other systems reviewed and are negative.   Allergies  Review of patient's allergies indicates no known allergies.  Home Medications   Prior to Admission medications   Medication Sig Start Date End Date Taking? Authorizing Provider  nitrofurantoin, macrocrystal-monohydrate, (MACROBID) 100 MG capsule Take 1 capsule (100 mg total) by mouth 2 (two) times daily. Patient not taking: Reported on 02/11/2015 11/26/14   Jaynie Crumble, PA-C  Prenatal Vit-Fe Fumarate-FA (PRENATAL COMPLETE) 14-0.4 MG TABS Take 1 tablet by mouth daily. 11/26/14   Tatyana  Kirichenko, PA-C   BP 150/83 mmHg  Pulse 88  Temp(Src) 97.8 F (36.6 C) (Oral)  Resp 18  Ht  (1.753 m)  Wt 290 lb (131.543 kg)  BMI 42.81 kg/m2  SpO2 100%  LMP 10/01/2014 (Approximate) Physical Exam  Constitutional: She is oriented to person, place, and time. She appears well-developed and well-nourished.  HENT:  Head: Normocephalic.  Eyes: EOM are normal.  Neck: Normal range of motion.  Pulmonary/Chest: Effort normal.  Abdominal: She exhibits no distension. There is no tenderness.  Gravid uterus, fundus is about the height of the umbilicus  Musculoskeletal: Normal range of motion.  Neurological: She is alert and oriented to person, place, and time.  Psychiatric: She has a normal mood and affect.  Nursing note and vitals reviewed.   ED Course  Procedures  DIAGNOSTIC STUDIES: Oxygen Saturation is 100% on RA, normal by my interpretation.    COORDINATION OF CARE: 4:01 PM - Discussed plans to perform a pelvic exam. Pt advised of plan for treatment and pt agrees.  Labs Review Labs Reviewed  WET PREP, GENITAL  URINALYSIS, ROUTINE W REFLEX MICROSCOPIC (NOT AT [facility])  GC/CHLAMYDIA PROBE AMP ([place]) NOT AT [facility]    Imaging Review No results found. I have personally reviewed and evaluated these images and lab results as part of my medical decision-making.   EKG Interpretation None      MDM   Final diagnoses:  Bacterial vaginosis    29yf with  increased odorless whitish vaginal discharge. Suspect simply increased leukorrhea in pregnancy. Pelvic exam unremarkable. Pt concerned for possible STD. Will send GC. Empiric abx deferred.   Does have BV. Flagyl. Outpt FU.   I personally preformed the services scribed in my presence. The recorded information has been reviewed is accurate. Raeford Razor, MD.   Raeford Razor, MD 03/31/15 [phone]

## 2015-03-27 NOTE — Discharge Instructions (Signed)
Bacterial Vaginosis  Bacterial vaginosis is a vaginal infection that occurs when the normal balance of bacteria in the vagina is disrupted. It results from an overgrowth of certain bacteria. This is the most common vaginal infection in women of childbearing age. Treatment is important to prevent complications, especially in pregnant women, as it can cause a premature delivery.  CAUSES   Bacterial vaginosis is caused by an increase in harmful bacteria that are normally present in smaller amounts in the vagina. Several different kinds of bacteria can cause bacterial vaginosis. However, the reason that the condition develops is not fully understood.  RISK FACTORS  Certain activities or behaviors can put you at an increased risk of developing bacterial vaginosis, including:  · Having a new sex partner or multiple sex partners.  · Douching.  · Using an intrauterine device (IUD) for contraception.  Women do not get bacterial vaginosis from toilet seats, bedding, swimming pools, or contact with objects around them.  SIGNS AND SYMPTOMS   Some women with bacterial vaginosis have no signs or symptoms. Common symptoms include:  · Grey vaginal discharge.  · A fishlike odor with discharge, especially after sexual intercourse.  · Itching or burning of the vagina and vulva.  · Burning or pain with urination.  DIAGNOSIS   Your health care provider will take a medical history and examine the vagina for signs of bacterial vaginosis. A sample of vaginal fluid may be taken. Your health care provider will look at this sample under a microscope to check for bacteria and abnormal cells. A vaginal pH test may also be done.   TREATMENT   Bacterial vaginosis may be treated with antibiotic medicines. These may be given in the form of a pill or a vaginal cream. A second round of antibiotics may be prescribed if the condition comes back after treatment. Because bacterial vaginosis increases your risk for sexually transmitted diseases, getting  treated can help reduce your risk for chlamydia, gonorrhea, HIV, and herpes.  HOME CARE INSTRUCTIONS   · Only take over-the-counter or prescription medicines as directed by your health care provider.  · If antibiotic medicine was prescribed, take it as directed. Make sure you finish it even if you start to feel better.  · Tell all sexual partners that you have a vaginal infection. They should see their health care provider and be treated if they have problems, such as a mild rash or itching.  · During treatment, it is important that you follow these instructions:  ¨ Avoid sexual activity or use condoms correctly.  ¨ Do not douche.  ¨ Avoid alcohol as directed by your health care provider.  ¨ Avoid breastfeeding as directed by your health care provider.  SEEK MEDICAL CARE IF:   · Your symptoms are not improving after 3 days of treatment.  · You have increased discharge or pain.  · You have a fever.  MAKE SURE YOU:   · Understand these instructions.  · Will watch your condition.  · Will get help right away if you are not doing well or get worse.  FOR MORE INFORMATION   Centers for Disease Control and Prevention, Division of STD Prevention: www.cdc.gov/std  American Sexual Health Association (ASHA): www.ashastd.org      This information is not intended to replace advice given to you by your health care provider. Make sure you discuss any questions you have with your health care provider.     Document Released: 06/04/2005 Document Revised: 06/25/2014 Document Reviewed: 01/14/2013    Elsevier Interactive Patient Education ©2016 Elsevier Inc.  Bacterial Vaginosis  Bacterial vaginosis is a vaginal infection that occurs when the normal balance of bacteria in the vagina is disrupted. It results from an overgrowth of certain bacteria. This is the most common vaginal infection in women of childbearing age. Treatment is important to prevent complications, especially in pregnant women, as it can cause a premature delivery.  CAUSES    Bacterial vaginosis is caused by an increase in harmful bacteria that are normally present in smaller amounts in the vagina. Several different kinds of bacteria can cause bacterial vaginosis. However, the reason that the condition develops is not fully understood.  RISK FACTORS  Certain activities or behaviors can put you at an increased risk of developing bacterial vaginosis, including:  · Having a new sex partner or multiple sex partners.  · Douching.  · Using an intrauterine device (IUD) for contraception.  Women do not get bacterial vaginosis from toilet seats, bedding, swimming pools, or contact with objects around them.  SIGNS AND SYMPTOMS   Some women with bacterial vaginosis have no signs or symptoms. Common symptoms include:  · Grey vaginal discharge.  · A fishlike odor with discharge, especially after sexual intercourse.  · Itching or burning of the vagina and vulva.  · Burning or pain with urination.  DIAGNOSIS   Your health care provider will take a medical history and examine the vagina for signs of bacterial vaginosis. A sample of vaginal fluid may be taken. Your health care provider will look at this sample under a microscope to check for bacteria and abnormal cells. A vaginal pH test may also be done.   TREATMENT   Bacterial vaginosis may be treated with antibiotic medicines. These may be given in the form of a pill or a vaginal cream. A second round of antibiotics may be prescribed if the condition comes back after treatment. Because bacterial vaginosis increases your risk for sexually transmitted diseases, getting treated can help reduce your risk for chlamydia, gonorrhea, HIV, and herpes.  HOME CARE INSTRUCTIONS   · Only take over-the-counter or prescription medicines as directed by your health care provider.  · If antibiotic medicine was prescribed, take it as directed. Make sure you finish it even if you start to feel better.  · Tell all sexual partners that you have a vaginal infection. They  should see their health care provider and be treated if they have problems, such as a mild rash or itching.  · During treatment, it is important that you follow these instructions:  ¨ Avoid sexual activity or use condoms correctly.  ¨ Do not douche.  ¨ Avoid alcohol as directed by your health care provider.  ¨ Avoid breastfeeding as directed by your health care provider.  SEEK MEDICAL CARE IF:   · Your symptoms are not improving after 3 days of treatment.  · You have increased discharge or pain.  · You have a fever.  MAKE SURE YOU:   · Understand these instructions.  · Will watch your condition.  · Will get help right away if you are not doing well or get worse.  FOR MORE INFORMATION   Centers for Disease Control and Prevention, Division of STD Prevention: www.cdc.gov/std  American Sexual Health Association (ASHA): www.ashastd.org      This information is not intended to replace advice given to you by your health care provider. Make sure you discuss any questions you have with your health care provider.     Document   Released: 06/04/2005 Document Revised: 06/25/2014 Document Reviewed: 01/14/2013  Elsevier Interactive Patient Education ©2016 Elsevier Inc.

## 2015-03-28 LAB — GC/CHLAMYDIA PROBE AMP (~~LOC~~) NOT AT ARMC
Chlamydia: NEGATIVE
Neisseria Gonorrhea: NEGATIVE

## 2015-04-05 ENCOUNTER — Ambulatory Visit (HOSPITAL_COMMUNITY)
Admission: RE | Admit: 2015-04-05 | Discharge: 2015-04-05 | Disposition: A | Discharge status: Home / Self Care | Payer: Medicaid Other | Source: Ambulatory Visit | Attending: Obstetrics and Gynecology | Admitting: Obstetrics and Gynecology

## 2015-04-05 ENCOUNTER — Encounter (HOSPITAL_COMMUNITY): Payer: Self-pay

## 2015-04-05 DIAGNOSIS — E669 Obesity, unspecified: Secondary | ICD-10-CM | POA: Insufficient documentation

## 2015-04-05 DIAGNOSIS — O99212 Obesity complicating pregnancy, second trimester: Secondary | ICD-10-CM | POA: Insufficient documentation

## 2015-12-17 ENCOUNTER — Encounter (HOSPITAL_COMMUNITY): Payer: Self-pay | Admitting: *Deleted

## 2021-03-24 ENCOUNTER — Other Ambulatory Visit: Payer: Self-pay

## 2021-03-24 ENCOUNTER — Encounter (HOSPITAL_BASED_OUTPATIENT_CLINIC_OR_DEPARTMENT_OTHER): Payer: Self-pay | Admitting: *Deleted

## 2021-03-24 ENCOUNTER — Emergency Department (HOSPITAL_BASED_OUTPATIENT_CLINIC_OR_DEPARTMENT_OTHER): Payer: Medicaid Other

## 2021-03-24 ENCOUNTER — Emergency Department (HOSPITAL_BASED_OUTPATIENT_CLINIC_OR_DEPARTMENT_OTHER)
Admission: EM | Admit: 2021-03-24 | Discharge: 2021-03-24 | Disposition: A | Discharge status: Home / Self Care | Payer: Medicaid Other | Attending: Emergency Medicine | Admitting: Emergency Medicine

## 2021-03-24 DIAGNOSIS — Z87891 Personal history of nicotine dependence: Secondary | ICD-10-CM | POA: Insufficient documentation

## 2021-03-24 DIAGNOSIS — I82401 Acute embolism and thrombosis of unspecified deep veins of right lower extremity: Secondary | ICD-10-CM | POA: Insufficient documentation

## 2021-03-24 DIAGNOSIS — R0602 Shortness of breath: Secondary | ICD-10-CM | POA: Diagnosis not present

## 2021-03-24 DIAGNOSIS — D72829 Elevated white blood cell count, unspecified: Secondary | ICD-10-CM | POA: Diagnosis not present

## 2021-03-24 DIAGNOSIS — M79606 Pain in leg, unspecified: Secondary | ICD-10-CM

## 2021-03-24 DIAGNOSIS — D649 Anemia, unspecified: Secondary | ICD-10-CM | POA: Insufficient documentation

## 2021-03-24 DIAGNOSIS — M79604 Pain in right leg: Secondary | ICD-10-CM | POA: Diagnosis present

## 2021-03-24 DIAGNOSIS — I82491 Acute embolism and thrombosis of other specified deep vein of right lower extremity: Secondary | ICD-10-CM

## 2021-03-24 LAB — CBC WITH DIFFERENTIAL/PLATELET
Abs Immature Granulocytes: 0.08 10*3/uL — ABNORMAL HIGH (ref 0.00–0.07)
Basophils Absolute: 0 10*3/uL (ref 0.0–0.1)
Basophils Relative: 0 %
Eosinophils Absolute: 0 10*3/uL (ref 0.0–0.5)
Eosinophils Relative: 0 %
HCT: 33 % — ABNORMAL LOW (ref 36.0–46.0)
Hemoglobin: 11.1 g/dL — ABNORMAL LOW (ref 12.0–15.0)
Immature Granulocytes: 1 %
Lymphocytes Relative: 5 %
Lymphs Abs: 0.5 10*3/uL — ABNORMAL LOW (ref 0.7–4.0)
MCH: 30.1 pg (ref 26.0–34.0)
MCHC: 33.6 g/dL (ref 30.0–36.0)
MCV: 89.4 fL (ref 80.0–100.0)
Monocytes Absolute: 0.3 10*3/uL (ref 0.1–1.0)
Monocytes Relative: 3 %
Neutro Abs: 9.8 10*3/uL — ABNORMAL HIGH (ref 1.7–7.7)
Neutrophils Relative %: 91 %
Platelets: 231 10*3/uL (ref 150–400)
RBC: 3.69 MIL/uL — ABNORMAL LOW (ref 3.87–5.11)
RDW: 14.8 % (ref 11.5–15.5)
WBC: 10.7 10*3/uL — ABNORMAL HIGH (ref 4.0–10.5)
nRBC: 0 % (ref 0.0–0.2)

## 2021-03-24 LAB — BASIC METABOLIC PANEL
Anion gap: 6 (ref 5–15)
BUN: 12 mg/dL (ref 6–20)
CO2: 21 mmol/L — ABNORMAL LOW (ref 22–32)
Calcium: 8.4 mg/dL — ABNORMAL LOW (ref 8.9–10.3)
Chloride: 104 mmol/L (ref 98–111)
Creatinine, Ser: 1.16 mg/dL — ABNORMAL HIGH (ref 0.44–1.00)
GFR, Estimated: >60 mL/min (ref >60)
Glucose, Bld: 121 mg/dL — ABNORMAL HIGH (ref 70–99)
Potassium: 3.6 mmol/L (ref 3.5–5.1)
Sodium: 131 mmol/L — ABNORMAL LOW (ref 135–145)

## 2021-03-24 LAB — LACTIC ACID, PLASMA: Lactic Acid, Venous: 1.1 mmol/L (ref 0.5–1.9)

## 2021-03-24 MED ORDER — APIXABAN 2.5 MG PO TABS
10.0000 mg | ORAL_TABLET | Freq: Two times a day (BID) | ORAL | Status: DC
  Administered 2021-03-24: 10 mg via ORAL
  Filled 2021-03-24: qty 4

## 2021-03-24 MED ORDER — APIXABAN 2.5 MG PO TABS: 5.0000 mg | ORAL_TABLET | Freq: Two times a day (BID) | ORAL | Status: DC

## 2021-03-24 MED ORDER — ONDANSETRON HCL 4 MG/2ML IJ SOLN
4.0000 mg | Freq: Once | INTRAMUSCULAR | Status: AC
  Administered 2021-03-24: 4 mg via INTRAVENOUS
  Filled 2021-03-24: qty 2

## 2021-03-24 MED ORDER — IOHEXOL 350 MG/ML SOLN
100.0000 mL | Freq: Once | INTRAVENOUS | Status: AC | PRN
  Administered 2021-03-24: 100 mL via INTRAVENOUS

## 2021-03-24 MED ORDER — APIXABAN 5 MG PO TABS: ORAL_TABLET | ORAL | 0 refills | Status: AC

## 2021-03-24 MED ORDER — APIXABAN 5 MG PO TABS: ORAL_TABLET | ORAL | 0 refills | Status: DC

## 2021-03-24 MED ORDER — MORPHINE SULFATE (PF) 4 MG/ML IV SOLN
4.0000 mg | Freq: Once | INTRAVENOUS | Status: AC
  Administered 2021-03-24: 4 mg via INTRAVENOUS
  Filled 2021-03-24: qty 1

## 2021-03-24 MED ORDER — MORPHINE SULFATE (PF) 2 MG/ML IV SOLN
2.0000 mg | Freq: Once | INTRAVENOUS | Status: AC
  Administered 2021-03-24: 2 mg via INTRAVENOUS
  Filled 2021-03-24: qty 1

## 2021-03-24 NOTE — Discharge Instructions (Addendum)
You can use tylenol for pain. You may use 1000 mg of Tylenol every 6 hours. This would be most effective.  Not to exceed 4 g of Tylenol within 24 hours.   As we discussed, there is some evidence of a potential minor kidney injury, I recommend that you follow-up with your primary care for discussion of continuing use of Eliquis as well as for evaluation of kidney function.  Please return for evaluation prior to seeing your primary care if you begin having chest pain, shortness of breath, cough especially with blood.

## 2021-03-24 NOTE — ED Triage Notes (Signed)
C/o right leg redness and swelling x 2 days

## 2021-03-24 NOTE — ED Provider Notes (Signed)
Accepted handoff at shift change from [facility]. Please see prior provider note for more detail.   Briefly: Patient is 35 y.o.   DDX: concern for ultrasound confirmed DVT of the right lower extremity.  CTA pending for evaluation of pulmonary embolism.  Patient does report some minimal shortness of breath, without chest pain.  Plan: DC with Eliquis if CT is negative versus admit for PE if positive.  CT of the chest does not reveal pulmonary embolism.  Patient discharged with Eliquis, instructions for correct use of medication were given.  Patient had a mild elevation of creatinine with no baseline kidney function on file.  Recommend she follow-up with her primary care for further evaluation, and also discussion of new Eliquis prescription.  Patient encouraged to use Tylenol for pain control.  Patient encouraged to follow-up if she has worsening shortness of breath, chest pain, hemoptysis prior to being able to see her primary care.  Patient discharged in stable condition, return precautions are given.   [place] 03/24/21 2243    Gwyneth Sprout, MD 03/24/21 2316

## 2021-03-24 NOTE — ED Provider Notes (Signed)
[facility] EMERGENCY DEPARTMENT Provider Note   CSN: [id number] Arrival date & time: 03/24/21  1614     History Chief Complaint  Patient presents with  . Leg Swelling    Norma Lin is a 35 y.o. female who presents to the emergency department for 1 day history of right lower leg pain and swelling. Her leg pain has been constant and worsening. She rates her pain a 10/10 in severity. Her leg pain is worse with ambulation. She also reports associated shortness of breath but denies any chest pain, fevers, chills, nausea, vomiting, diarrhea. Nothing seems to make it better. No urinary symptoms.   The history is provided by the patient. No language interpreter was used.      Past Medical History:  Diagnosis Date  . Medical history non-contributory     There are no problems to display for this patient.   Past Surgical History:  Procedure Laterality Date  . NO PAST SURGERIES       OB History     Gravida  1   Para      Term      Preterm      AB      Living         SAB      IAB      Ectopic      Multiple      Live Births              No family history on file.  Social History   Tobacco Use  . Smoking status: Former  Substance Use Topics  . Alcohol use: Yes    Comment: none with pregnancy  . Drug use: No    Home Medications Prior to Admission medications   Medication Sig Start Date End Date Taking? Authorizing Provider  metroNIDAZOLE (FLAGYL) 500 MG tablet Take 1 tablet (500 mg total) by mouth 2 (two) times daily. Patient not taking: Reported on 04/05/2015 03/27/15   Raeford Razor, MD  nitrofurantoin, macrocrystal-monohydrate, (MACROBID) 100 MG capsule Take 1 capsule (100 mg total) by mouth 2 (two) times daily. Patient not taking: Reported on 02/11/2015 11/26/14   Jaynie Crumble, PA-C  Prenatal Vit-Fe Fumarate-FA (PRENATAL COMPLETE) 14-0.4 MG TABS Take 1 tablet by mouth daily. 11/26/14   Jaynie Crumble, PA-C    Allergies     Patient has no known allergies.  Review of Systems   Review of Systems  Genitourinary:  Negative for dysuria, frequency, hematuria and urgency.  All other systems reviewed and are negative.  Physical Exam Updated Vital Signs BP 121/82   Pulse 78   Temp 99.1 F (37.3 C) (Oral)   Resp 18   Ht 5\' 9"  (1.753 m)   Wt 106.6 kg   LMP 03/24/2021   SpO2 100%   BMI 34.70 kg/m   Physical Exam Constitutional:      General: She is not in acute distress.    Appearance: Normal appearance.  HENT:     Head: Normocephalic and atraumatic.  Eyes:     General:        Right eye: No discharge.        Left eye: No discharge.  Cardiovascular:     Comments: Regular rate and rhythm.  S1/S2 are distinct without any evidence of murmur, rubs, or gallops.  Radial pulses are 2+ bilaterally.  Dorsalis pedis pulses are 2+ bilaterally.  No evidence of pedal edema. Pulmonary:     Comments: Clear to auscultation bilaterally.  Normal  effort.  No respiratory distress.  No evidence of wheezes, rales, or rhonchi heard throughout. Abdominal:     General: Abdomen is flat. Bowel sounds are normal. There is no distension.     Tenderness: There is no abdominal tenderness. There is no guarding or rebound.  Musculoskeletal:        General: Normal range of motion.     Cervical back: Neck supple.  Skin:    General: Skin is warm and dry.     Comments: Right lower extremity swelling, erythema, and warmth to palpation. Positive calf tenderness.   Neurological:     General: No focal deficit present.     Mental Status: She is alert.  Psychiatric:        Mood and Affect: Mood normal.        Behavior: Behavior normal.    ED Results / Procedures / Treatments   Labs (all labs ordered are listed, but only abnormal results are displayed) Labs Reviewed  CBC WITH DIFFERENTIAL/PLATELET - Abnormal; Notable for the following components:      Result Value   WBC 10.7 (*)    RBC 3.69 (*)    Hemoglobin 11.1 (*)    HCT 33.0  (*)    Neutro Abs 9.8 (*)    Lymphs Abs 0.5 (*)    Abs Immature Granulocytes 0.08 (*)    All other components within normal limits  BASIC METABOLIC PANEL - Abnormal; Notable for the following components:   Sodium 131 (*)    CO2 21 (*)    Glucose, Bld 121 (*)    Creatinine, Ser 1.16 (*)    Calcium 8.4 (*)    All other components within normal limits  LACTIC ACID, PLASMA  LACTIC ACID, PLASMA    EKG None  Radiology US Venous Img Lower Unilateral Right (DVT)  Result Date: 03/24/2021 CLINICAL DATA:  Leg pain for 5 days EXAM: RIGHT LOWER EXTREMITY VENOUS DOPPLER ULTRASOUND TECHNIQUE: Gray-scale sonography with compression, as well as color and duplex ultrasound, were performed to evaluate the deep venous system(s) from the level of the common femoral vein through the popliteal and proximal calf veins. COMPARISON:  None. FINDINGS: VENOUS Normal compressibility of the common femoral, superficial femoral, and popliteal veins. Occlusive deep venous thrombosis of the posterior tibial vein. Visualized portions of profunda femoral vein and great saphenous vein unremarkable. No filling defects to suggest DVT on grayscale or color Doppler imaging. Doppler waveforms show normal direction of venous flow, normal respiratory plasticity and response to augmentation. Limited views of the contralateral common femoral vein are unremarkable. OTHER Prominent right inguinal lymph node. Limitations: none IMPRESSION: Occlusive deep venous thrombosis of the posterior tibial vein. Electronically Signed   By: Elige Ko M.D.   On: 03/24/2021 18:32    Procedures Procedures   Medications Ordered in ED Medications  morphine 2 MG/ML injection 2 mg (2 mg Intravenous Given 03/24/21 1823)  ondansetron (ZOFRAN) injection 4 mg (4 mg Intravenous Given 03/24/21 1822)  iohexol (OMNIPAQUE) 350 MG/ML injection 100 mL (100 mLs Intravenous Contrast Given 03/24/21 1853)    ED Course  I have reviewed the triage vital signs and the  nursing notes.  Pertinent labs & imaging results that were available during my care of the patient were reviewed by me and considered in my medical decision making (see chart for details).  Clinical Course as of 03/24/21 2005  Fri Mar 24, 2021  2003 Hot red swollen right leg Small DVT at right ankle CTA pending  DC with close recheck or admit PE [CP]    Clinical Course User Index [CP] Prosperi, Harrel Carina, PA-C   MDM Rules/Calculators/A&P                          Kaley Jutras is a 35 y.o. female who presents to the emergency department for right lower leg swelling, pain, and redness.  She and physical exam was concerning for deep venous thrombosis.  Given that she was also tachycardic on initial presentation and complaining of new shortness of breath I did a CT angiography to evaluate for pulmonary embolism in addition to US of the leg.   CBC revealed mild leukocytosis and mild anemia.  This appears to be chronic when comparing to old laboratory results on Care Everywhere.  CMP revealed mild hyponatremia and elevated creatinine.  I have no previous creatinine on record to compare to.  Ultrasound of the right lower extremity showed a posterior tibial vein DVT.  CTA still pending.  If CTA is negative she will be able to go home on anticoagulation.  If CTA is positive she should be admitted for further evaluation.  The rest of her care was transferred to [facility] PA-C at shift change.   Final Clinical Impression(s) / ED Diagnoses Final diagnoses:  Leg pain    Rx / DC Orders ED Discharge Orders     None        Jolyn Lent 03/24/21 Harlan Stains, MD 03/24/21 2315

## 2021-04-26 ENCOUNTER — Emergency Department (HOSPITAL_BASED_OUTPATIENT_CLINIC_OR_DEPARTMENT_OTHER): Payer: Medicaid Other

## 2021-04-26 ENCOUNTER — Encounter (HOSPITAL_BASED_OUTPATIENT_CLINIC_OR_DEPARTMENT_OTHER): Payer: Self-pay | Admitting: *Deleted

## 2021-04-26 ENCOUNTER — Emergency Department (HOSPITAL_BASED_OUTPATIENT_CLINIC_OR_DEPARTMENT_OTHER)
Admission: EM | Admit: 2021-04-26 | Discharge: 2021-04-26 | Disposition: A | Discharge status: Home / Self Care | Payer: Medicaid Other | Attending: Emergency Medicine | Admitting: Emergency Medicine

## 2021-04-26 ENCOUNTER — Other Ambulatory Visit: Payer: Self-pay

## 2021-04-26 DIAGNOSIS — Z87891 Personal history of nicotine dependence: Secondary | ICD-10-CM | POA: Diagnosis not present

## 2021-04-26 DIAGNOSIS — R0602 Shortness of breath: Secondary | ICD-10-CM | POA: Diagnosis not present

## 2021-04-26 DIAGNOSIS — Z20822 Contact with and (suspected) exposure to covid-19: Secondary | ICD-10-CM | POA: Diagnosis not present

## 2021-04-26 DIAGNOSIS — R059 Cough, unspecified: Secondary | ICD-10-CM | POA: Insufficient documentation

## 2021-04-26 DIAGNOSIS — Z7901 Long term (current) use of anticoagulants: Secondary | ICD-10-CM | POA: Diagnosis not present

## 2021-04-26 DIAGNOSIS — R052 Subacute cough: Secondary | ICD-10-CM

## 2021-04-26 DIAGNOSIS — R0981 Nasal congestion: Secondary | ICD-10-CM | POA: Insufficient documentation

## 2021-04-26 HISTORY — DX: Acute embolism and thrombosis of unspecified deep veins of unspecified lower extremity: I82.409

## 2021-04-26 LAB — RESP PANEL BY RT-PCR (FLU A&B, COVID) ARPGX2
Influenza A by PCR: NEGATIVE
Influenza B by PCR: NEGATIVE
SARS Coronavirus 2 by RT PCR: NEGATIVE

## 2021-04-26 MED ORDER — HYDROCOD POLST-CPM POLST ER 10-8 MG/5ML PO SUER
5.0000 mL | Freq: Once | ORAL | Status: AC
  Administered 2021-04-26: 5 mL via ORAL
  Filled 2021-04-26: qty 5

## 2021-04-26 MED ORDER — BENZONATATE 100 MG PO CAPS: 100.0000 mg | ORAL_CAPSULE | Freq: Three times a day (TID) | ORAL | 0 refills | Status: DC

## 2021-04-26 NOTE — ED Provider Notes (Signed)
[facility] EMERGENCY DEPARTMENT Provider Note   CSN: [id number] Arrival date & time: 04/26/21  1508     History Chief Complaint  Patient presents with   Cough    Norma Lin is a 35 y.o. female.  HPI  Patient with medical history of DVT 1 month ago on Eliquis presents with cough.  The cough is persistent, constant and has been ongoing for 2 weeks.  It is dry cough, no hemoptysis or sputum.  She felt short of breath earlier today, but did not have any chest pain.  She also endorses some associated nasal congestion but no myalgia, headache, sore throat.  Her son recently had an ear infection, no other sick contacts.  She has tried over-the-counter Robitussin and cough drops with minimal relief.  Past Medical History:  Diagnosis Date   DVT (deep venous thrombosis) (HCC)    Medical history non-contributory     There are no problems to display for this patient.   Past Surgical History:  Procedure Laterality Date   NO PAST SURGERIES       OB History     Gravida  1   Para      Term      Preterm      AB      Living         SAB      IAB      Ectopic      Multiple      Live Births              No family history on file.  Social History   Tobacco Use   Smoking status: Former  Substance Use Topics   Alcohol use: Yes    Comment: none with pregnancy   Drug use: No    Home Medications Prior to Admission medications   Medication Sig Start Date End Date Taking? Authorizing Provider  apixaban (ELIQUIS) 5 MG TABS tablet Take 2 tablets (10mg ) twice daily for 7 days, then 1 tablet (5mg ) twice daily 03/24/21   Prosperi, Christian H, PA-C  metroNIDAZOLE (FLAGYL) 500 MG tablet Take 1 tablet (500 mg total) by mouth 2 (two) times daily. Patient not taking: Reported on 04/05/2015 03/27/15   04/07/2015, MD  nitrofurantoin, macrocrystal-monohydrate, (MACROBID) 100 MG capsule Take 1 capsule (100 mg total) by mouth 2 (two) times daily. Patient not  taking: Reported on 02/11/2015 11/26/14   02/13/2015, PA-C  Prenatal Vit-Fe Fumarate-FA (PRENATAL COMPLETE) 14-0.4 MG TABS Take 1 tablet by mouth daily. 11/26/14   Jaynie Crumble, PA-C    Allergies    Patient has no known allergies.  Review of Systems   Review of Systems  HENT:  Positive for congestion. Negative for sore throat.   Respiratory:  Positive for cough and shortness of breath.   Cardiovascular:  Negative for chest pain.  Musculoskeletal:  Negative for myalgias.  Neurological:  Negative for headaches.   Physical Exam Updated Vital Signs BP (!) 141/100 (BP Location: Right Arm)   Pulse 76   Temp 98.8 F (37.1 C) (Oral)   Resp 18   Ht 5\' 9"  (1.753 m)   Wt 108.9 kg   LMP 04/26/2021   SpO2 100%   BMI 35.44 kg/m   Physical Exam Vitals and nursing note reviewed. Exam conducted with a chaperone present.  Constitutional:      Appearance: Normal appearance.  HENT:     Head: Normocephalic and atraumatic.     Nose: Congestion present.  Mouth/Throat:     Pharynx: No posterior oropharyngeal erythema.  Eyes:     General: No scleral icterus.       Right eye: No discharge.        Left eye: No discharge.     Extraocular Movements: Extraocular movements intact.     Pupils: Pupils are equal, round, and reactive to light.  Cardiovascular:     Rate and Rhythm: Normal rate and regular rhythm.     Pulses: Normal pulses.     Heart sounds: Normal heart sounds. No murmur heard.   No friction rub. No gallop.  Pulmonary:     Effort: Pulmonary effort is normal. No respiratory distress.     Breath sounds: Normal breath sounds.     Comments: Lungs are clear to auscultation bilaterally.  Speaking complete sentences, no accessory muscle use or tachypnea. Abdominal:     General: Abdomen is flat. Bowel sounds are normal. There is no distension.     Palpations: Abdomen is soft.     Tenderness: There is no abdominal tenderness.  Skin:    General: Skin is warm and dry.      Coloration: Skin is not jaundiced.  Neurological:     Mental Status: She is alert. Mental status is at baseline.     Coordination: Coordination normal.    ED Results / Procedures / Treatments   Labs (all labs ordered are listed, but only abnormal results are displayed) Labs Reviewed  RESP PANEL BY RT-PCR (FLU A&B, COVID) [id number]    EKG None  Radiology No results found.  Procedures Procedures   Medications Ordered in ED Medications - No data to display  ED Course  I have reviewed the triage vital signs and the nursing notes.  Pertinent labs & imaging results that were available during my care of the patient were reviewed by me and considered in my medical decision making (see chart for details).    MDM Rules/Calculators/A&P                           Mildly hypertensive, vitals are stable and she is nontoxic-appearing.  No tachypnea, tachycardia or hypoxia-she is taking Eliquis daily and I have a very low suspicion at this is a PE.  She reports with persistent nonproductive cough, has tried over-the-counter remedies without any improvement.  We will get a chest x-ray and COVID and flu swab.  We will also try supportive medicine.  Radiograph negative for any pneumonia.  No signs of cardiomegaly.  COVID and flu test is still pending.  She has had 2 negative CTAs in the past month.  Given she has not had any tachycardia or hypoxia or shortness of breath I really do not think she is having a PE.  Suspect viral.  Patient discharged in stable position.  Discussed HPI, physical exam and plan of care for this patient with attending Dr. Chaney Malling. The attending physician evaluated this patient as part of a shared visit and agrees with plan of care.   Final Clinical Impression(s) / ED Diagnoses Final diagnoses:  None    Rx / DC Orders ED Discharge Orders     None        Theron Arista, PA-C 04/26/21 1600    Charlynne Pander, MD 04/30/21 1451

## 2021-04-26 NOTE — Discharge Instructions (Addendum)
Take the Tessalon Perles every 8 hours as needed for cough symptoms. You can continue taking cough drops and drinking warm tea if that will help improve your symptoms. Up with your primary care doctor if no improvement, return to the ED if things change or worsen.

## 2021-04-26 NOTE — ED Triage Notes (Addendum)
C/o dry cough x 2 weeks, pt is on elaquis for recent DVT

## 2021-04-26 NOTE — ED Notes (Signed)
Patient ambulatory to Xray.

## 2022-05-30 ENCOUNTER — Emergency Department (HOSPITAL_BASED_OUTPATIENT_CLINIC_OR_DEPARTMENT_OTHER)
Admission: EM | Admit: 2022-05-30 | Discharge: 2022-05-30 | Disposition: A | Discharge status: Home / Self Care | Payer: Medicaid Other | Attending: Emergency Medicine | Admitting: Emergency Medicine

## 2022-05-30 ENCOUNTER — Emergency Department (HOSPITAL_BASED_OUTPATIENT_CLINIC_OR_DEPARTMENT_OTHER): Payer: Medicaid Other

## 2022-05-30 ENCOUNTER — Other Ambulatory Visit: Payer: Self-pay

## 2022-05-30 ENCOUNTER — Encounter (HOSPITAL_BASED_OUTPATIENT_CLINIC_OR_DEPARTMENT_OTHER): Payer: Self-pay

## 2022-05-30 DIAGNOSIS — Z7901 Long term (current) use of anticoagulants: Secondary | ICD-10-CM | POA: Insufficient documentation

## 2022-05-30 DIAGNOSIS — R519 Headache, unspecified: Secondary | ICD-10-CM | POA: Insufficient documentation

## 2022-05-30 DIAGNOSIS — L03115 Cellulitis of right lower limb: Secondary | ICD-10-CM | POA: Diagnosis not present

## 2022-05-30 DIAGNOSIS — R2241 Localized swelling, mass and lump, right lower limb: Secondary | ICD-10-CM | POA: Diagnosis present

## 2022-05-30 DIAGNOSIS — M7989 Other specified soft tissue disorders: Secondary | ICD-10-CM

## 2022-05-30 LAB — CBC WITH DIFFERENTIAL/PLATELET
Abs Immature Granulocytes: 0.04 10*3/uL (ref 0.00–0.07)
Basophils Absolute: 0 10*3/uL (ref 0.0–0.1)
Basophils Relative: 0 %
Eosinophils Absolute: 0 10*3/uL (ref 0.0–0.5)
Eosinophils Relative: 1 %
HCT: 34.9 % — ABNORMAL LOW (ref 36.0–46.0)
Hemoglobin: 11.9 g/dL — ABNORMAL LOW (ref 12.0–15.0)
Immature Granulocytes: 1 %
Lymphocytes Relative: 18 %
Lymphs Abs: 1.4 10*3/uL (ref 0.7–4.0)
MCH: 33.2 pg (ref 26.0–34.0)
MCHC: 34.1 g/dL (ref 30.0–36.0)
MCV: 97.5 fL (ref 80.0–100.0)
Monocytes Absolute: 0.5 10*3/uL (ref 0.1–1.0)
Monocytes Relative: 6 %
Neutro Abs: 6.1 10*3/uL (ref 1.7–7.7)
Neutrophils Relative %: 74 %
Platelets: 229 10*3/uL (ref 150–400)
RBC: 3.58 MIL/uL — ABNORMAL LOW (ref 3.87–5.11)
RDW: 14.1 % (ref 11.5–15.5)
WBC: 8.2 10*3/uL (ref 4.0–10.5)
nRBC: 0 % (ref 0.0–0.2)

## 2022-05-30 LAB — COMPREHENSIVE METABOLIC PANEL
ALT: 31 U/L (ref 0–44)
AST: 18 U/L (ref 15–41)
Albumin: 3.1 g/dL — ABNORMAL LOW (ref 3.5–5.0)
Alkaline Phosphatase: 55 U/L (ref 38–126)
Anion gap: 7 (ref 5–15)
BUN: 8 mg/dL (ref 6–20)
CO2: 23 mmol/L (ref 22–32)
Calcium: 8.3 mg/dL — ABNORMAL LOW (ref 8.9–10.3)
Chloride: 109 mmol/L (ref 98–111)
Creatinine, Ser: 0.82 mg/dL (ref 0.44–1.00)
GFR, Estimated: >60 mL/min (ref >60)
Glucose, Bld: 89 mg/dL (ref 70–99)
Potassium: 3.8 mmol/L (ref 3.5–5.1)
Sodium: 139 mmol/L (ref 135–145)
Total Bilirubin: 0.6 mg/dL (ref 0.3–1.2)
Total Protein: 7.4 g/dL (ref 6.5–8.1)

## 2022-05-30 LAB — PROTIME-INR
INR: 1.1 (ref 0.8–1.2)
Prothrombin Time: 13.8 seconds (ref 11.4–15.2)

## 2022-05-30 MED ORDER — SODIUM CHLORIDE 0.9 % IV BOLUS
1000.0000 mL | Freq: Once | INTRAVENOUS | Status: AC
  Administered 2022-05-30: 1000 mL via INTRAVENOUS

## 2022-05-30 MED ORDER — CEPHALEXIN 500 MG PO CAPS: 500.0000 mg | ORAL_CAPSULE | Freq: Four times a day (QID) | ORAL | 0 refills | Status: DC

## 2022-05-30 NOTE — ED Notes (Signed)
Discharge instructions reviewed with patient. Patient verbalizes understanding, no further questions at this time. Medications/prescriptions and follow up information provided. No acute distress noted at time of departure.  

## 2022-05-30 NOTE — Discharge Instructions (Signed)
You were seen in the emergency department for evaluation of headache and right leg swelling redness.  You had an ultrasound that did not show any evidence of blood clot.  Rest your blood counts look fairly normal.  We are starting you on some antibiotics for possible infection.  Please elevate your leg when possible.  Follow-up with your OB as scheduled.  Return to the emergency department if any worsening or concerning symptoms.

## 2022-05-30 NOTE — ED Triage Notes (Addendum)
C/o right lower leg swelling, headache x 2 days. Hx of DVT & cellulitis. Taking Lovenox injections. States had nails done on Friday, they accidentally cut the bottom of her feet.  Recently vaginal delivery with epidural on 11/28.

## 2022-05-30 NOTE — ED Provider Notes (Signed)
[facility] EMERGENCY DEPARTMENT Provider Note   CSN: [id number] Arrival date & time: 05/30/22  2536     History  Chief Complaint  Patient presents with   Leg Swelling    Norma Lin is a 36 y.o. female.  She is here with a complaint of headache and worsening swelling of right leg and pain.  She had a spontaneous vaginal delivery about 2 weeks ago under epidural anesthesia.  She was doing fine until 2 days ago when she experienced posterior headache dull nagging.  She is also had new swelling of her right leg.  She did say that she had her toenails done and they scraped the bottom of her feet and she had a wound there.  She had a DVT in her right leg about a year ago, unclear why and has been on Lovenox throughout her pregnancy and continues to take it now.  She denies any chest pain or shortness of breath.  No numbness or weakness.  She did not have any problems with hypertension during her pregnancy.  The history is provided by the patient.  Leg Pain Location:  Leg Time since incident:  3 days Injury: no   Leg location:  R leg Pain details:    Quality:  Aching   Severity:  Moderate   Onset quality:  Gradual   Progression:  Unchanged Chronicity:  New Relieved by:  None tried Worsened by:  Nothing Ineffective treatments:  None tried Associated symptoms: swelling   Associated symptoms: no fever, no muscle weakness and no numbness        Home Medications Prior to Admission medications   Medication Sig Start Date End Date Taking? Authorizing Provider  apixaban (ELIQUIS) 5 MG TABS tablet Take 2 tablets (10mg ) twice daily for 7 days, then 1 tablet (5mg ) twice daily 03/24/21   Prosperi, Christian H, PA-C  benzonatate (TESSALON) 100 MG capsule Take 1 capsule (100 mg total) by mouth every 8 (eight) hours. 04/26/21   08-16-2001, PA-C  metroNIDAZOLE (FLAGYL) 500 MG tablet Take 1 tablet (500 mg total) by mouth 2 (two) times daily. Patient not taking: Reported on 04/05/2015  03/27/15   04/07/2015, MD  nitrofurantoin, macrocrystal-monohydrate, (MACROBID) 100 MG capsule Take 1 capsule (100 mg total) by mouth 2 (two) times daily. Patient not taking: Reported on 02/11/2015 11/26/14   02/13/2015, PA-C  Prenatal Vit-Fe Fumarate-FA (PRENATAL COMPLETE) 14-0.4 MG TABS Take 1 tablet by mouth daily. 11/26/14   Jaynie Crumble, PA-C      Allergies    Patient has no known allergies.    Review of Systems   Review of Systems  Constitutional:  Negative for fever.  HENT:  Negative for sore throat.   Eyes:  Negative for visual disturbance.  Respiratory:  Negative for shortness of breath.   Cardiovascular:  Positive for leg swelling. Negative for chest pain.  Gastrointestinal:  Negative for abdominal pain.  Genitourinary:  Negative for dysuria.  Skin:  Negative for rash.  Neurological:  Positive for headaches.    Physical Exam Updated Vital Signs BP 136/81 (BP Location: Right Arm)   Pulse 83   Temp 98.6 F (37 C) (Oral)   Resp 18   Ht 5\' 8"  (1.727 m)   Wt 122.5 kg   SpO2 100%   BMI 41.05 kg/m  Physical Exam Vitals and nursing note reviewed.  Constitutional:      General: She is not in acute distress.    Appearance: Normal appearance. She is well-developed.  HENT:     Head: Normocephalic and atraumatic.  Eyes:     Conjunctiva/sclera: Conjunctivae normal.  Cardiovascular:     Rate and Rhythm: Normal rate and regular rhythm.     Heart sounds: No murmur heard. Pulmonary:     Effort: Pulmonary effort is normal. No respiratory distress.     Breath sounds: Normal breath sounds.  Abdominal:     Palpations: Abdomen is soft.     Tenderness: There is no abdominal tenderness. There is no guarding or rebound.  Musculoskeletal:     Cervical back: Neck supple.     Right lower leg: Edema present.  Skin:    General: Skin is warm and dry.     Capillary Refill: Capillary refill takes less than 2 seconds.  Neurological:     General: No focal deficit  present.     Mental Status: She is alert.     ED Results / Procedures / Treatments   Labs (all labs ordered are listed, but only abnormal results are displayed) Labs Reviewed  COMPREHENSIVE METABOLIC PANEL - Abnormal; Notable for the following components:      Result Value   Calcium 8.3 (*)    Albumin 3.1 (*)    All other components within normal limits  CBC WITH DIFFERENTIAL/PLATELET - Abnormal; Notable for the following components:   RBC 3.58 (*)    Hemoglobin 11.9 (*)    HCT 34.9 (*)    All other components within normal limits  PROTIME-INR    EKG None  Radiology US Venous Img Lower Right (DVT Study)  Result Date: 05/30/2022 CLINICAL DATA:  36 year old female with history of right leg swelling. EXAM: RIGHT LOWER EXTREMITY VENOUS DOPPLER ULTRASOUND TECHNIQUE: Gray-scale sonography with graded compression, as well as color Doppler and duplex ultrasound were performed to evaluate the right lower extremity deep venous systems from the level of the common femoral vein and including the common femoral, femoral, profunda femoral, popliteal and calf veins including the posterior tibial, peroneal and gastrocnemius veins when visible. Spectral Doppler was utilized to evaluate flow at rest and with distal augmentation maneuvers in the common femoral, femoral and popliteal veins. The contralateral common femoral vein was also evaluated for comparison. COMPARISON:  None Available. FINDINGS: RIGHT LOWER EXTREMITY Common Femoral Vein: No evidence of thrombus. Normal compressibility, respiratory phasicity and response to augmentation. Central Greater Saphenous Vein: No evidence of thrombus. Normal compressibility and flow on color Doppler imaging. Central Profunda Femoral Vein: No evidence of thrombus. Normal compressibility and flow on color Doppler imaging. Femoral Vein: No evidence of thrombus. Normal compressibility, respiratory phasicity and response to augmentation. Popliteal Vein: No evidence  of thrombus. Normal compressibility, respiratory phasicity and response to augmentation. Calf Veins: No evidence of thrombus. Normal compressibility and flow on color Doppler imaging. Other Findings: Similar appearing prominent right inguinal lymph node measuring up to 1.6 cm in short axis, demonstrating preserved fatty hilum. LEFT LOWER EXTREMITY Common Femoral Vein: No evidence of thrombus. Normal compressibility, respiratory phasicity and response to augmentation. IMPRESSION: 1. No evidence of right lower extremity deep venous thrombosis. 2. Similar appearing prominent right inguinal lymph node. Ruthann Cancer, MD [facility] Radiology Electronically Signed   By: Ruthann Cancer M.D.   On: 05/30/2022 10:49    Procedures Procedures    Medications Ordered in ED Medications  sodium chloride 0.9 % bolus 1,000 mL (has no administration in time range)    ED Course/ Medical Decision Making/ A&P Clinical Course as of 05/30/22  [place] May 30, 2022  1145 Discussed with Dr. Micah Noel patient's OB.  He said her blood pressure has been running a little high he is not worried about preeclampsia.  He said she can follow-up in the office next week.  He is fine with her going on some antibiotics and for concern there may be a leg infection. [MB]    Clinical Course User Index [MB] Hayden Rasmussen, MD                           Medical Decision Making Amount and/or Complexity of Data Reviewed Labs: ordered.  Risk Prescription drug management.   This patient complains of posterior headache, right leg swelling; this involves an extensive number of treatment Options and is a complaint that carries with it a high risk of complications and morbidity. The differential includes generalized headache, spinal headache, preeclampsia, DVT, cellulitis, peripheral edema  I ordered, reviewed and interpreted labs, which included CBC with normal Canche count stable  hemoglobin, chemistries and LFTs fairly unremarkable I ordered medication IV fluids and reviewed PMP when indicated. I ordered imaging studies which included duplex right lower extremity and I independently    visualized and interpreted imaging which showed no DVT Previous records obtained and reviewed in epic, just discharged after vaginal delivery from [facility] a few weeks ago  I consulted Dr. Micah Noel obstetrician and discussed lab and imaging findings and discussed disposition.  Cardiac monitoring reviewed, normal sinus rhythm Social determinants considered, no significant barriers Critical Interventions: None  After the interventions stated above, I reevaluated the patient and found patient to be oxygenating well on room air nontoxic-appearing Admission and further testing considered, no indications for admission or further workup at this time.  Will cover with antibiotics for possible cellulitis.  No evidence of DVT, headache otherwise does not seem to be acute concern of patient.  She is comfortable plan for follow-up with PCP.  Return instructions discussed         Final Clinical Impression(s) / ED Diagnoses Final diagnoses:  Right leg swelling  Cellulitis of right leg    Rx / DC Orders ED Discharge Orders          Ordered    cephALEXin (KEFLEX) 500 MG capsule  4 times daily        05/30/22 1150              Hayden Rasmussen, MD 05/30/22 [id number]

## 2023-06-02 IMAGING — CT CT ANGIO CHEST
2 of 8 series · 19 of 36 positions shown · IV contrast (Omnipaque)
Comparison: None.

CLINICAL DATA: Shortness of breath.  Leg swelling for 2 days.

EXAM:
CT ANGIOGRAPHY CHEST WITH CONTRAST
TECHNIQUE: Multidetector CT imaging of the chest was performed using the
standard protocol during bolus administration of intravenous
contrast. Multiplanar CT image reconstructions and MIPs were
obtained to evaluate the vascular anatomy.
CONTRAST:  100mL OMNIPAQUE IOHEXOL 350 MG/ML SOLN

[Series 6: pe thins · axial · 0.74mm/px · z∈[+1049,+1328]mm · 18 of 412 slices shown]
[im 20/412  lung]
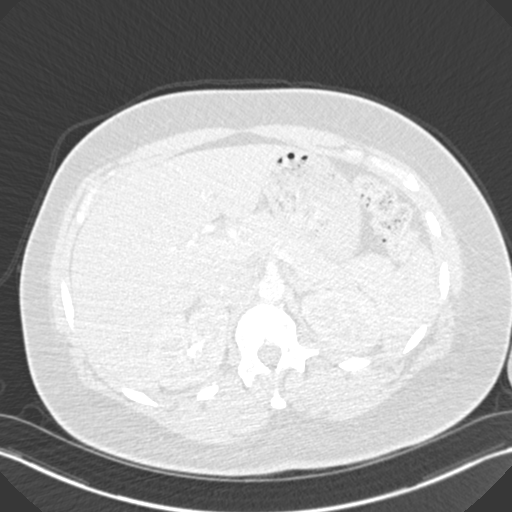
[im 40/412  mediastinal]
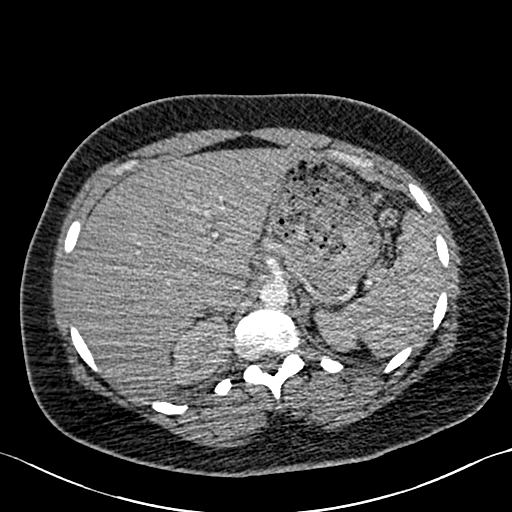
[im 59/412  lung]
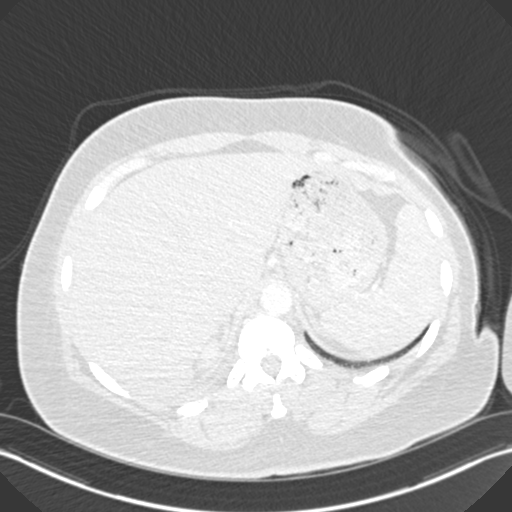
[im 79/412  mediastinal]
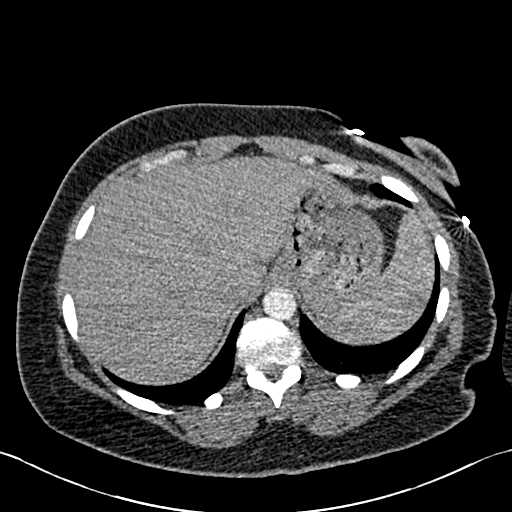
[im 118/412  lung]
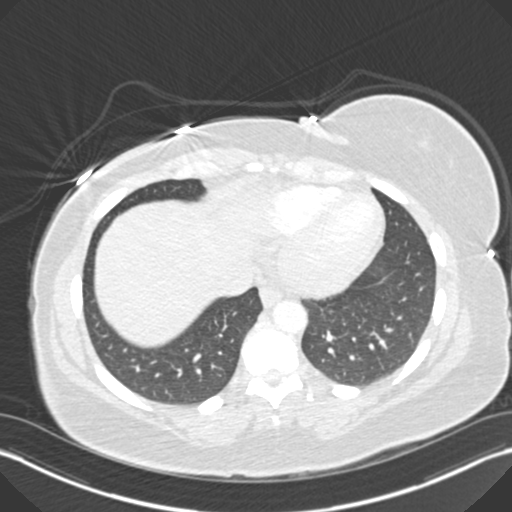
[im 138/412  mediastinal]
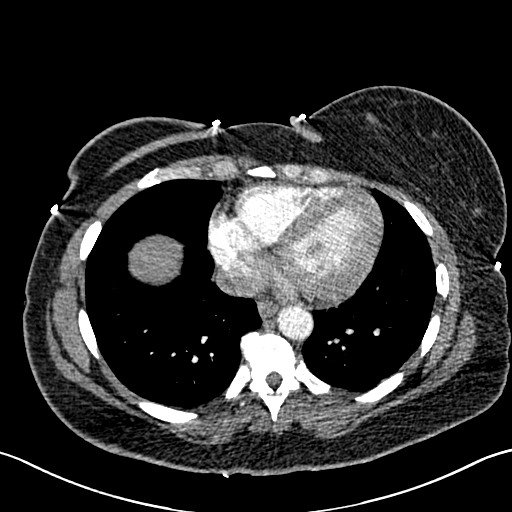
[im 157/412  lung]
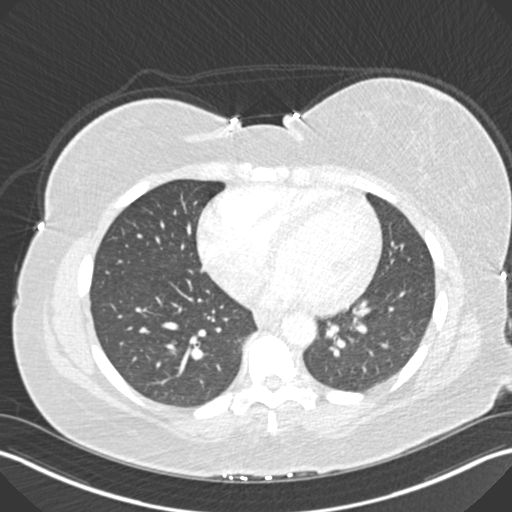
[im 177/412  mediastinal]
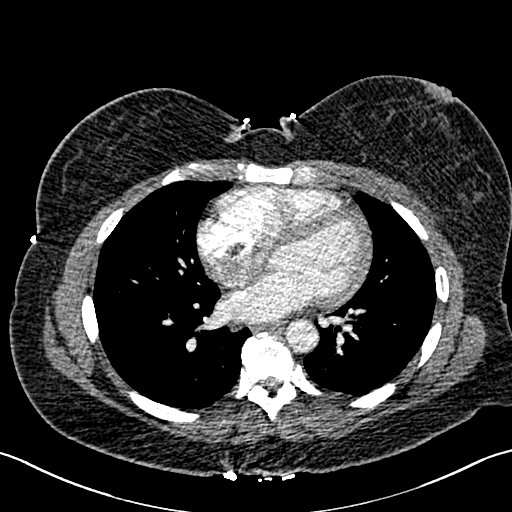
[im 196/412  lung]
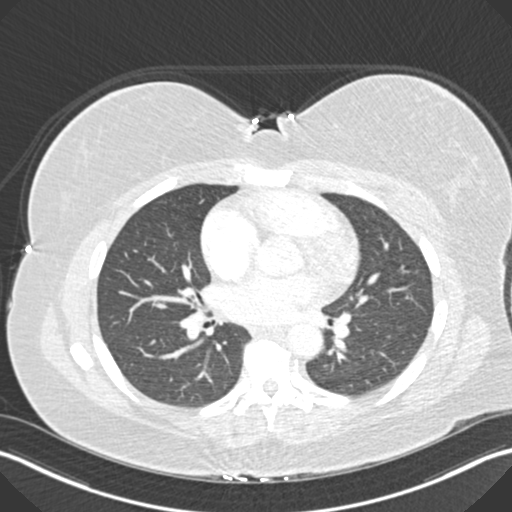
[im 216/412  mediastinal]
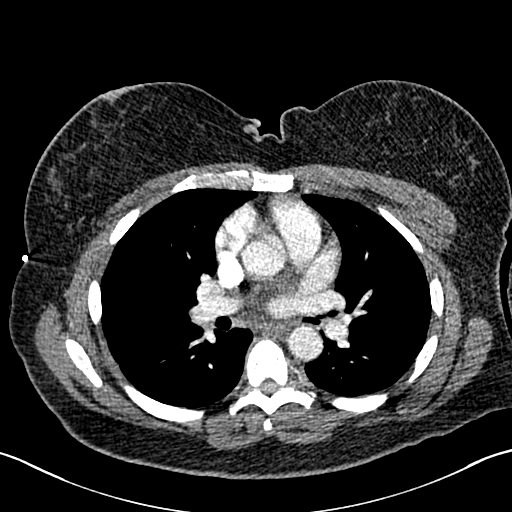
[im 235/412  lung]
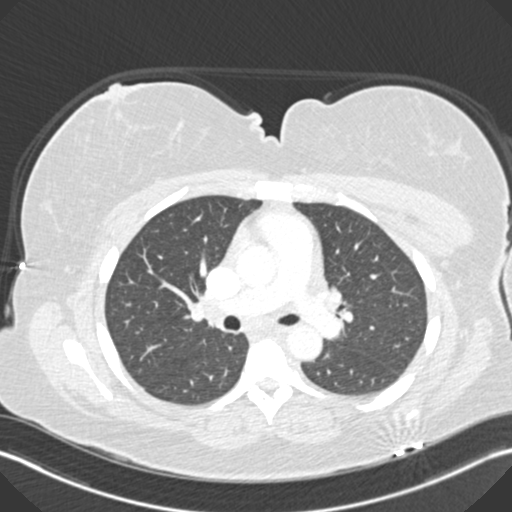
[im 255/412  mediastinal]
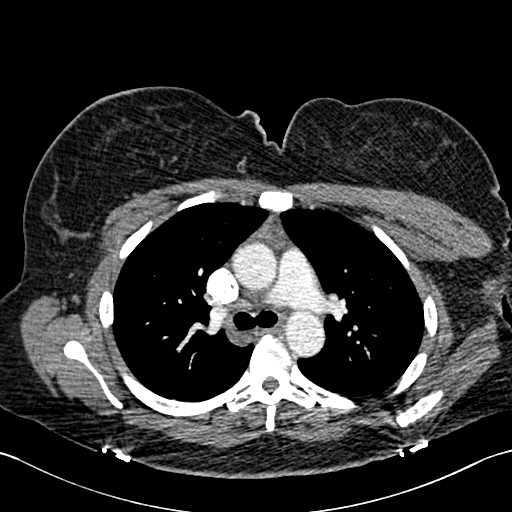
[im 275/412  lung]
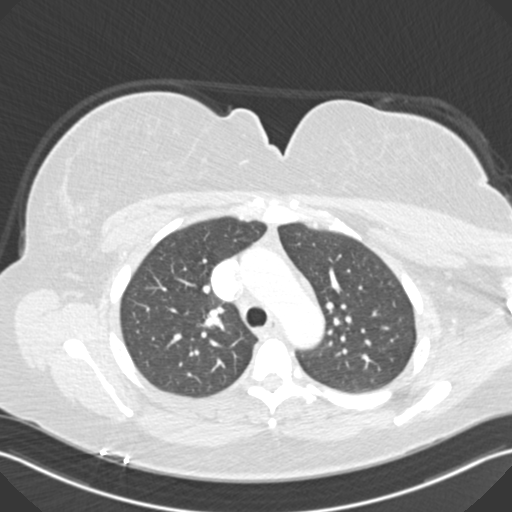
[im 314/412  mediastinal]
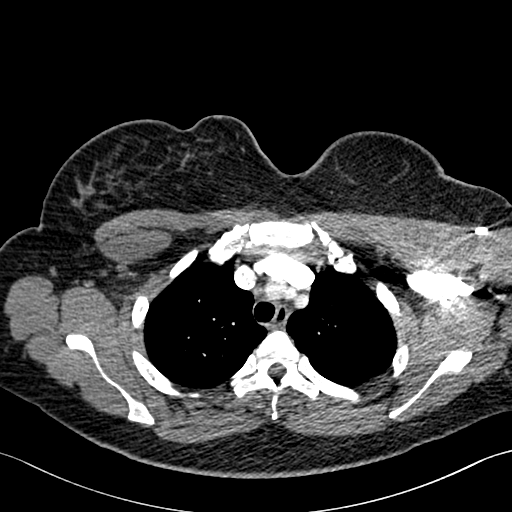
[im 333/412  lung]
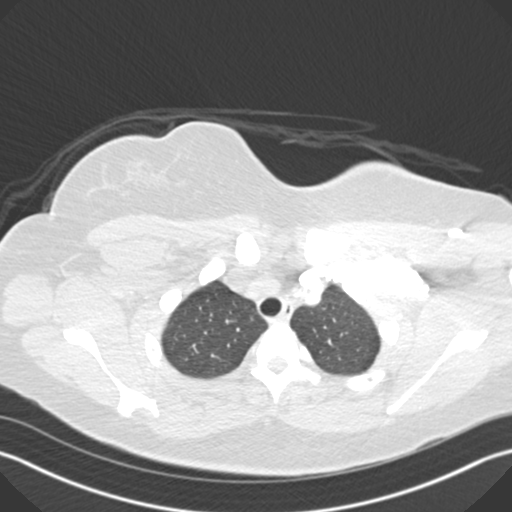
[im 353/412  mediastinal]
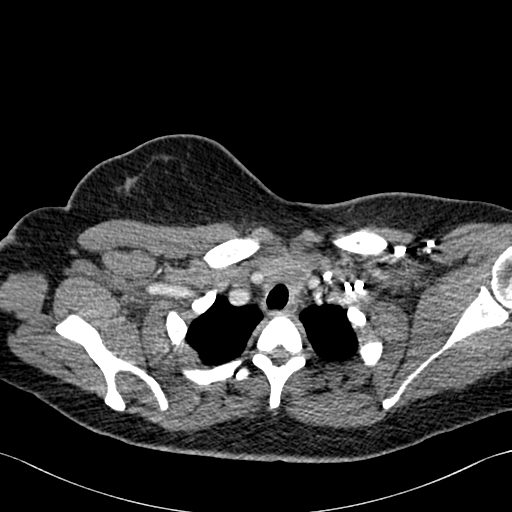
[im 372/412  lung]
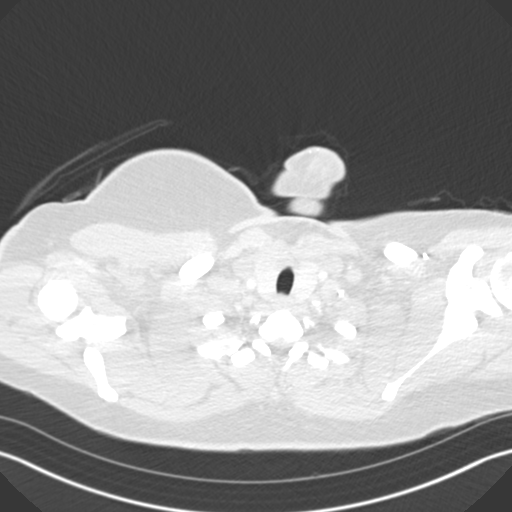
[im 392/412  mediastinal]
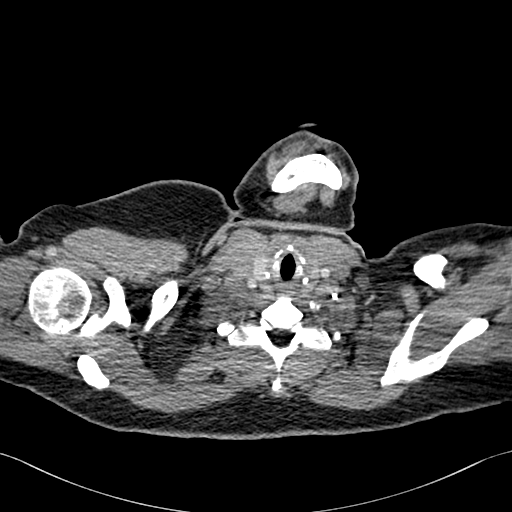

[Series 8: pe coronal mpr · coronal · 0.63mm/px · 1 of 100 slices shown]
[im 50/100  mediastinal]
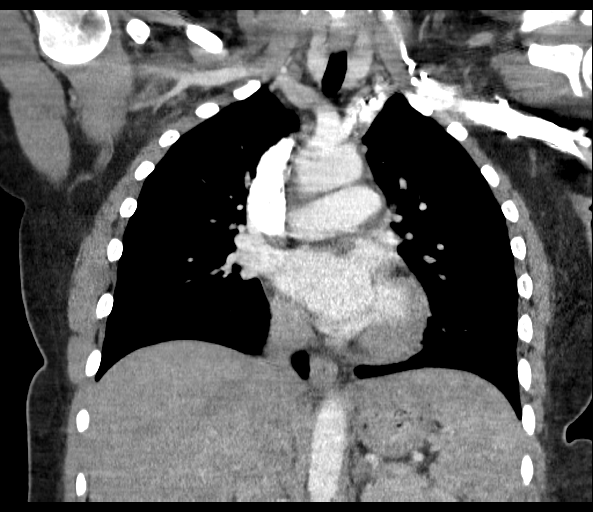

[19 of 36 positions shown; findings below may reference images not displayed]

FINDINGS: Cardiovascular: Fair opacification of the pulmonary arteries to the
segmental level due to timing of contrast. No evidence of central or
proximal segmental pulmonary embolism. Normal heart size. No
pericardial effusion.

Mediastinum/Nodes: No enlarged mediastinal, hilar, or axillary lymph
nodes. Thyroid gland, trachea, and esophagus demonstrate no
significant findings.

Lungs/Pleura: No focal consolidation. No pulmonary nodule. No
pulmonary mass. No pleural effusion. No pneumothorax.

Upper Abdomen: No acute abnormality.

Musculoskeletal:

No chest wall abnormality.

No suspicious lytic or blastic osseous lesions. No acute displaced
fracture. Multilevel degenerative changes of the spine.

Review of the MIP images confirms the above findings.
IMPRESSION: 1. No central or proximal segmental pulmonary embolus. Limited
evaluation due to timing of contrast.
2. No acute intrathoracic abnormality.

## 2023-07-05 IMAGING — CR DG CHEST 2V
2 series · 2 of 2 positions shown · non-contrast
Comparison: None.

CLINICAL DATA: Cough.

EXAM:
CHEST - 2 VIEW

[w chest pa]
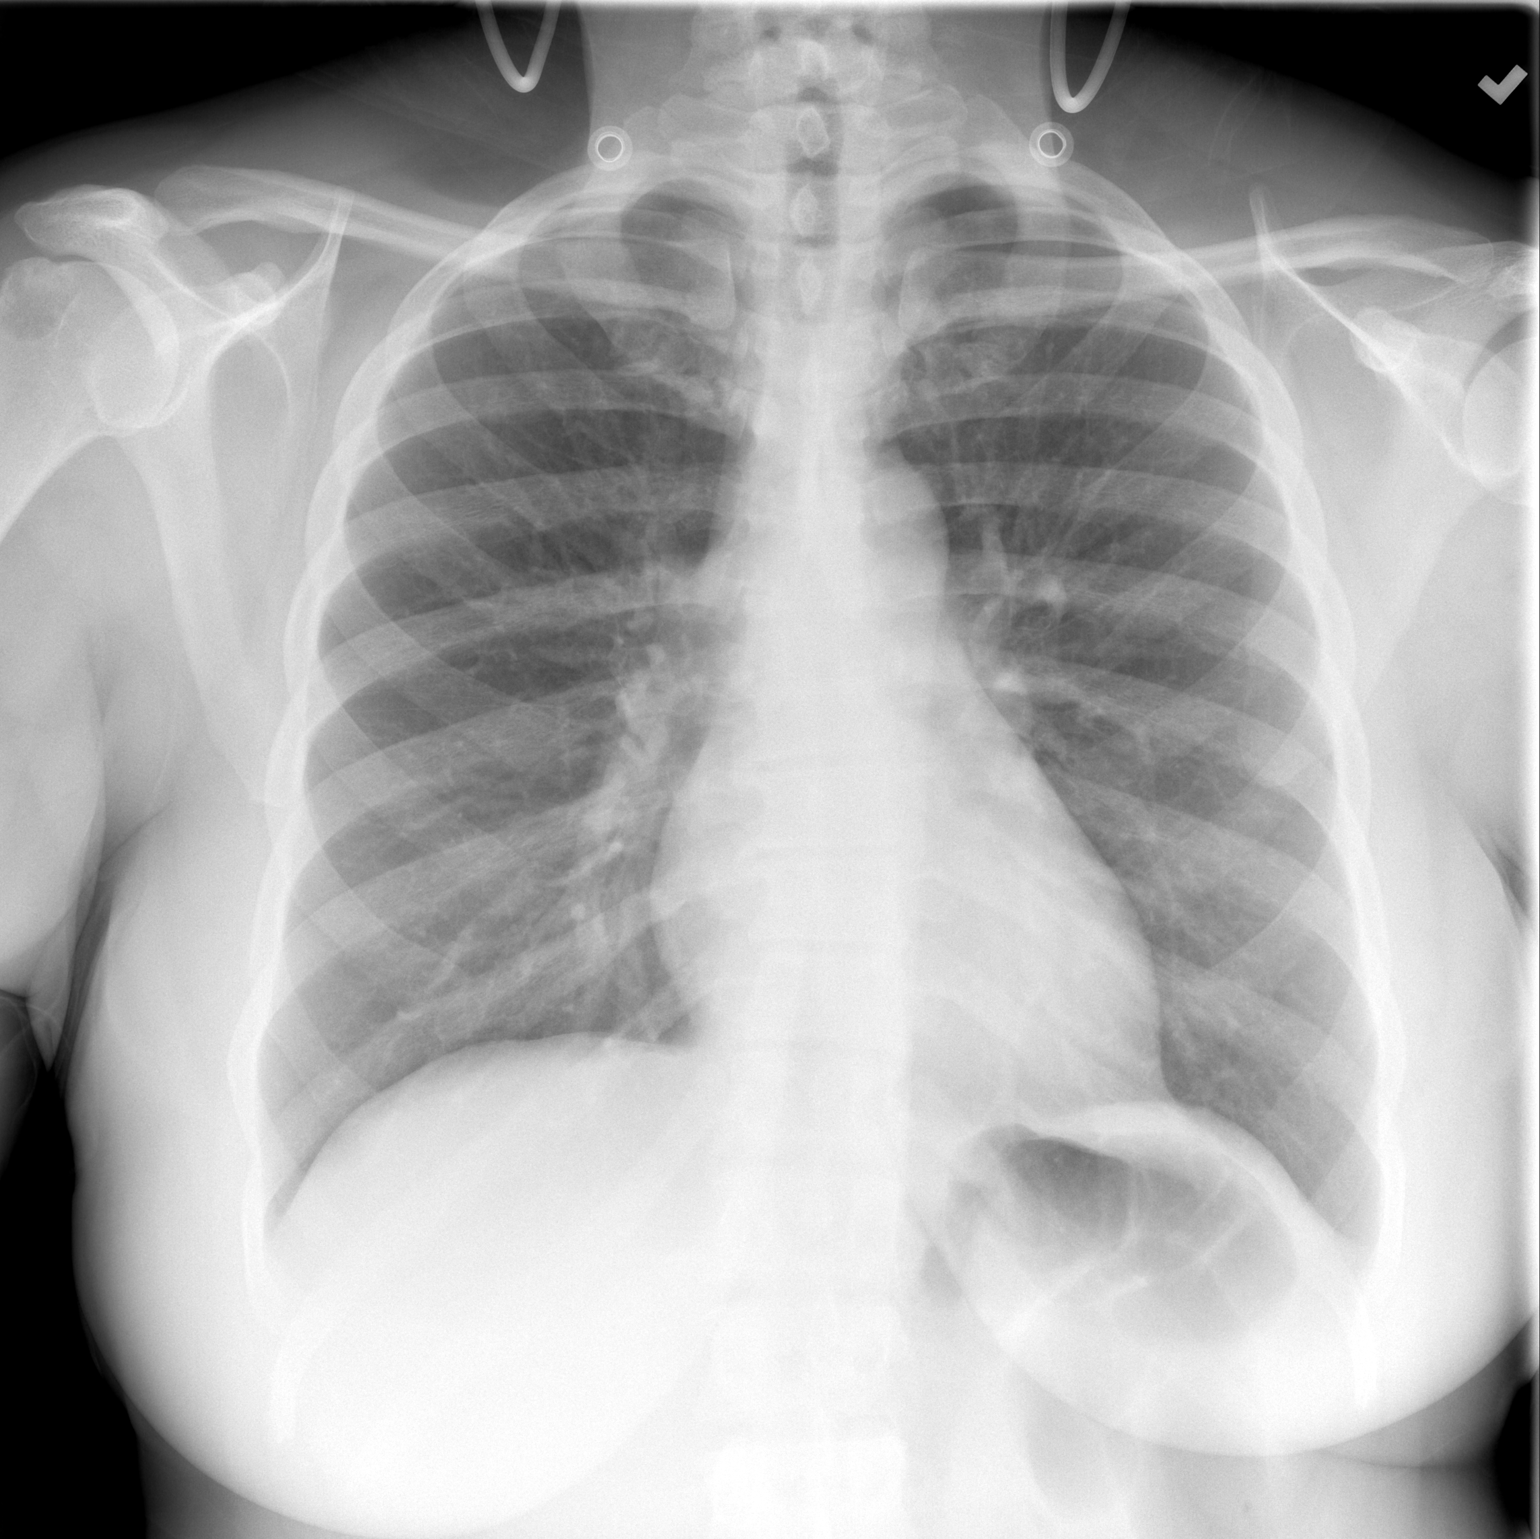

[w chest lat]
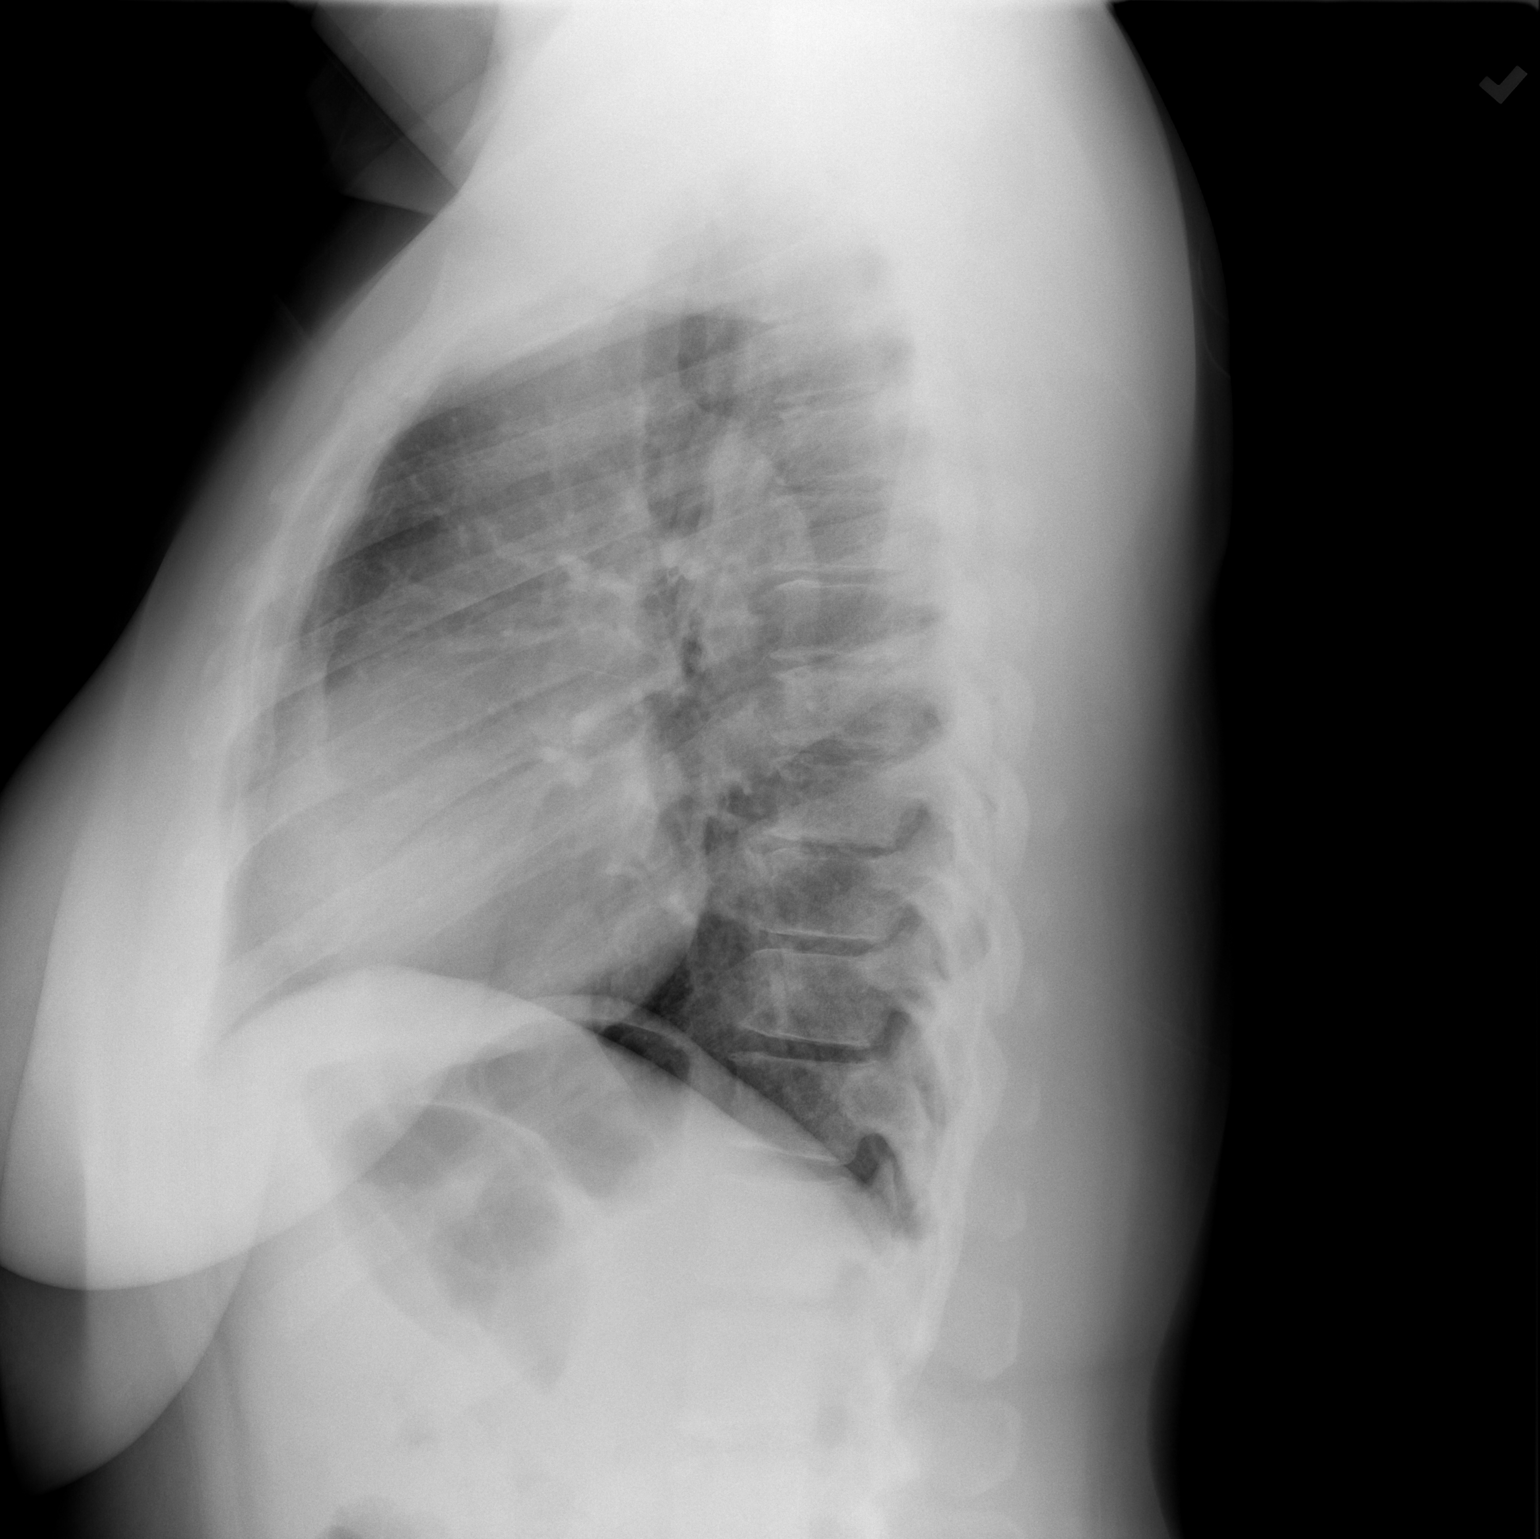

[2 of 2 positions shown; findings below may reference images not displayed]

FINDINGS: The heart size and mediastinal contours are within normal limits.
Both lungs are clear. The visualized skeletal structures are
unremarkable.
IMPRESSION: No active cardiopulmonary disease.

## 2023-10-11 ENCOUNTER — Encounter (HOSPITAL_BASED_OUTPATIENT_CLINIC_OR_DEPARTMENT_OTHER): Payer: Self-pay | Admitting: Urology

## 2023-10-11 ENCOUNTER — Other Ambulatory Visit: Payer: Self-pay

## 2023-10-11 ENCOUNTER — Emergency Department (HOSPITAL_BASED_OUTPATIENT_CLINIC_OR_DEPARTMENT_OTHER)
Admission: EM | Admit: 2023-10-11 | Discharge: 2023-10-11 | Disposition: A | Discharge status: Home / Self Care | Attending: Emergency Medicine | Admitting: Emergency Medicine

## 2023-10-11 DIAGNOSIS — Z7901 Long term (current) use of anticoagulants: Secondary | ICD-10-CM | POA: Insufficient documentation

## 2023-10-11 DIAGNOSIS — R300 Dysuria: Secondary | ICD-10-CM

## 2023-10-11 DIAGNOSIS — N390 Urinary tract infection, site not specified: Secondary | ICD-10-CM | POA: Insufficient documentation

## 2023-10-11 LAB — URINALYSIS, ROUTINE W REFLEX MICROSCOPIC

## 2023-10-11 LAB — URINALYSIS, MICROSCOPIC (REFLEX)
RBC / HPF: >50 RBC/hpf (ref 0–5)
WBC, UA: >50 WBC/hpf (ref 0–5)

## 2023-10-11 LAB — PREGNANCY, URINE: Preg Test, Ur: NEGATIVE

## 2023-10-11 MED ORDER — FLUCONAZOLE 150 MG PO TABS: ORAL_TABLET | ORAL | 0 refills | Status: AC

## 2023-10-11 MED ORDER — CEPHALEXIN 500 MG PO CAPS: 500.0000 mg | ORAL_CAPSULE | Freq: Four times a day (QID) | ORAL | 0 refills | Status: AC

## 2023-10-11 NOTE — ED Provider Notes (Signed)
 [place] EMERGENCY DEPARTMENT AT [facility] Provider Note   CSN: [id number] Arrival date & time: 10/11/23  1106     History  Chief Complaint  Patient presents with   Urinary Urgency    Norma Lin is a 38 y.o. female.  Patient complains of burning with urination.  Patient reports symptoms began today.  Patient reports no other symptoms.  Patient denies any abdominal pain or back pain.  She has not had any nausea or vomiting.  Patient denies any vaginal discharge.  No STD risk.  Patient reports she has not had urinary tract infections in the past.  Patient reports she is currently on her menstrual cycle.  Patient denies any risk of pregnancy  The history is provided by the patient. No language interpreter was used.  Dysuria Pain quality:  Aching Pain severity:  Mild Timing:  Constant Progression:  Worsening Chronicity:  New Recent urinary tract infections: no   Relieved by:  Nothing Worsened by:  Nothing Associated symptoms: no abdominal pain, no nausea and no vomiting        Home Medications Prior to Admission medications   Medication Sig Start Date End Date Taking? Authorizing Provider  cephALEXin  (KEFLEX ) 500 MG capsule Take 1 capsule (500 mg total) by mouth 4 (four) times daily. 10/11/23  Yes Merryn Thaker K, PA-C  fluconazole (DIFLUCAN) 150 MG tablet One tablet on day 3 and day 7 if symptoms of yeast infections 10/11/23  Yes Zaryan Yakubov K, PA-C  apixaban  (ELIQUIS ) 5 MG TABS tablet Take 2 tablets (10mg ) twice daily for 7 days, then 1 tablet (5mg ) twice daily 03/24/21   Prosperi, Christian H, PA-C      Allergies    Patient has no known allergies.    Review of Systems   Review of Systems  Gastrointestinal:  Negative for abdominal pain, nausea and vomiting.  Genitourinary:  Positive for dysuria.  All other systems reviewed and are negative.   Physical Exam Updated Vital Signs BP (!) 145/95 (BP Location: Left Arm)   Pulse 76   Temp 98 F (36.7 C)  (Oral)   Resp 17   Ht 5\' 8"  (1.727 m)   Wt 122.5 kg   SpO2 100%   BMI 41.06 kg/m  Physical Exam Vitals and nursing note reviewed.  Constitutional:      Appearance: She is well-developed.  HENT:     Head: Normocephalic.  Eyes:     Pupils: Pupils are equal, round, and reactive to light.  Cardiovascular:     Rate and Rhythm: Normal rate.  Pulmonary:     Effort: Pulmonary effort is normal.  Abdominal:     General: There is no distension.  Musculoskeletal:        General: Normal range of motion.     Cervical back: Normal range of motion.  Skin:    General: Skin is warm.  Neurological:     General: No focal deficit present.     Mental Status: She is alert and oriented to person, place, and time.  Psychiatric:        Mood and Affect: Mood normal.     ED Results / Procedures / Treatments   Labs (all labs ordered are listed, but only abnormal results are displayed) Labs Reviewed  URINALYSIS, ROUTINE W REFLEX MICROSCOPIC - Abnormal; Notable for the following components:      Result Value   Color, Urine RED (*)    APPearance TURBID (*)    Glucose, UA   (*)  Value: TEST NOT REPORTED DUE TO COLOR INTERFERENCE OF URINE PIGMENT   Hgb urine dipstick   (*)    Value: TEST NOT REPORTED DUE TO COLOR INTERFERENCE OF URINE PIGMENT   Bilirubin Urine   (*)    Value: TEST NOT REPORTED DUE TO COLOR INTERFERENCE OF URINE PIGMENT   Ketones, ur   (*)    Value: TEST NOT REPORTED DUE TO COLOR INTERFERENCE OF URINE PIGMENT   Protein, ur   (*)    Value: TEST NOT REPORTED DUE TO COLOR INTERFERENCE OF URINE PIGMENT   Nitrite   (*)    Value: TEST NOT REPORTED DUE TO COLOR INTERFERENCE OF URINE PIGMENT   Leukocytes,Ua   (*)    Value: TEST NOT REPORTED DUE TO COLOR INTERFERENCE OF URINE PIGMENT   All other components within normal limits  URINALYSIS, MICROSCOPIC (REFLEX) - Abnormal; Notable for the following components:   Bacteria, UA MANY (*)    All other components within normal limits   PREGNANCY, URINE    EKG None  Radiology No results found.  Procedures Procedures    Medications Ordered in ED Medications - No data to display  ED Course/ Medical Decision Making/ A&P                                 Medical Decision Making Pt complains of burning with urination   Amount and/or Complexity of Data Reviewed Labs: ordered. Decision-making details documented in ED Course.    Details: UA shows many bacteria and greater than 50 wbc's and greater than 50 rbcs  Risk Prescription drug management. Risk Details: Pt counseled on uti's.  Pt given rx for keflex .  Pt has a history of yeast with antibiotics.  Pt given rx for diflucan           Final Clinical Impression(s) / ED Diagnoses Final diagnoses:  Urinary tract infection without hematuria, site unspecified  Dysuria    Rx / DC Orders ED Discharge Orders          Ordered    cephALEXin  (KEFLEX ) 500 MG capsule  4 times daily        10/11/23 1214    fluconazole (DIFLUCAN) 150 MG tablet        10/11/23 1214           An After Visit Summary was printed and given to the patient.    Zabian Swayne K, PA-C 10/11/23 1422    Mozell Arias, MD 10/11/23 1444

## 2023-10-11 NOTE — Discharge Instructions (Signed)
 Return if any problems.

## 2023-10-11 NOTE — ED Notes (Signed)
 D/c paperwork reviewed with pt, including prescriptions and follow up care.  All questions and/or concerns addressed at time of d/c.  No further needs expressed. . Pt verbalized understanding, Ambulatory without assistance to ED exit, NAD.

## 2023-10-11 NOTE — ED Triage Notes (Signed)
 Pt states feels urge to urinated but only a little comes out  Urinated normally this am when waking up  Denies any pain or burning but feels urgency

## 2023-10-20 ENCOUNTER — Encounter (HOSPITAL_BASED_OUTPATIENT_CLINIC_OR_DEPARTMENT_OTHER): Payer: Self-pay | Admitting: Emergency Medicine

## 2023-10-20 ENCOUNTER — Other Ambulatory Visit: Payer: Self-pay

## 2023-10-20 DIAGNOSIS — N3001 Acute cystitis with hematuria: Secondary | ICD-10-CM | POA: Diagnosis not present

## 2023-10-20 DIAGNOSIS — Z7901 Long term (current) use of anticoagulants: Secondary | ICD-10-CM | POA: Diagnosis not present

## 2023-10-20 DIAGNOSIS — R300 Dysuria: Secondary | ICD-10-CM | POA: Diagnosis present

## 2023-10-20 LAB — URINALYSIS, ROUTINE W REFLEX MICROSCOPIC
Glucose, UA: NEGATIVE mg/dL
Ketones, ur: NEGATIVE mg/dL
Nitrite: POSITIVE — AB
Protein, ur: >=300 mg/dL — AB
Specific Gravity, Urine: 1.025 (ref 1.005–1.030)
pH: 6 (ref 5.0–8.0)

## 2023-10-20 LAB — URINALYSIS, MICROSCOPIC (REFLEX): RBC / HPF: >50 RBC/hpf (ref 0–5)

## 2023-10-20 NOTE — ED Triage Notes (Signed)
 Pt reports urinary urgency has returned; completed abx for UTI on Fri; reports a few drops of blood in urine this evening

## 2023-10-21 ENCOUNTER — Emergency Department (HOSPITAL_BASED_OUTPATIENT_CLINIC_OR_DEPARTMENT_OTHER)

## 2023-10-21 ENCOUNTER — Emergency Department (HOSPITAL_BASED_OUTPATIENT_CLINIC_OR_DEPARTMENT_OTHER)
Admission: EM | Admit: 2023-10-21 | Discharge: 2023-10-21 | Disposition: A | Discharge status: Home / Self Care | Attending: Emergency Medicine | Admitting: Emergency Medicine

## 2023-10-21 DIAGNOSIS — N3091 Cystitis, unspecified with hematuria: Secondary | ICD-10-CM

## 2023-10-21 LAB — COMPREHENSIVE METABOLIC PANEL WITH GFR
ALT: 35 U/L (ref 0–44)
AST: 23 U/L (ref 15–41)
Albumin: 4.1 g/dL (ref 3.5–5.0)
Alkaline Phosphatase: 39 U/L (ref 38–126)
Anion gap: 13 (ref 5–15)
BUN: 11 mg/dL (ref 6–20)
CO2: 23 mmol/L (ref 22–32)
Calcium: 9.4 mg/dL (ref 8.9–10.3)
Chloride: 101 mmol/L (ref 98–111)
Creatinine, Ser: 0.89 mg/dL (ref 0.44–1.00)
GFR, Estimated: >60 mL/min (ref >60)
Glucose, Bld: 92 mg/dL (ref 70–99)
Potassium: 4.2 mmol/L (ref 3.5–5.1)
Sodium: 138 mmol/L (ref 135–145)
Total Bilirubin: 0.3 mg/dL (ref 0.0–1.2)
Total Protein: 7.7 g/dL (ref 6.5–8.1)

## 2023-10-21 LAB — CBC WITH DIFFERENTIAL/PLATELET
Abs Immature Granulocytes: 0.01 10*3/uL (ref 0.00–0.07)
Basophils Absolute: 0 10*3/uL (ref 0.0–0.1)
Basophils Relative: 0 %
Eosinophils Absolute: 0.1 10*3/uL (ref 0.0–0.5)
Eosinophils Relative: 1 %
HCT: 37.2 % (ref 36.0–46.0)
Hemoglobin: 12.7 g/dL (ref 12.0–15.0)
Immature Granulocytes: 0 %
Lymphocytes Relative: 23 %
Lymphs Abs: 1.9 10*3/uL (ref 0.7–4.0)
MCH: 31.8 pg (ref 26.0–34.0)
MCHC: 34.1 g/dL (ref 30.0–36.0)
MCV: 93.2 fL (ref 80.0–100.0)
Monocytes Absolute: 0.6 10*3/uL (ref 0.1–1.0)
Monocytes Relative: 8 %
Neutro Abs: 5.5 10*3/uL (ref 1.7–7.7)
Neutrophils Relative %: 68 %
Platelets: 249 10*3/uL (ref 150–400)
RBC: 3.99 MIL/uL (ref 3.87–5.11)
RDW: 13.8 % (ref 11.5–15.5)
WBC: 8.2 10*3/uL (ref 4.0–10.5)
nRBC: 0 % (ref 0.0–0.2)

## 2023-10-21 LAB — PREGNANCY, URINE: Preg Test, Ur: NEGATIVE

## 2023-10-21 LAB — LIPASE, BLOOD: Lipase: 17 U/L (ref 11–51)

## 2023-10-21 MED ORDER — SODIUM CHLORIDE 0.9 % IV SOLN
1.0000 g | Freq: Once | INTRAVENOUS | Status: AC
  Administered 2023-10-21: 1 g via INTRAVENOUS
  Filled 2023-10-21: qty 10

## 2023-10-21 MED ORDER — AMOXICILLIN 500 MG PO CAPS: 500.0000 mg | ORAL_CAPSULE | Freq: Three times a day (TID) | ORAL | 0 refills | Status: AC

## 2023-10-21 NOTE — Discharge Instructions (Addendum)
 Keep yourself hydrated.  Take the antibiotic as prescribed.  You will be called if your urine culture grows bacteria and the antibiotic needs to be changed.  Follow-up with your primary doctor.  Return to the ED with worsening pain, fever, vomiting, able to eat or drink or other concerns

## 2023-10-21 NOTE — ED Provider Notes (Signed)
 [place] [facility] Provider Note   CSN: [id number] Arrival date & time: 10/20/23  2000     History  Chief Complaint  Patient presents with   Dysuria    Norma Lin is a 38 y.o. female.  Patient with concern for recurrent UTI.  She was treated for UTI in April 25 and completed a 1 week course of Keflex .  She states she did feel improved but symptoms returned again yesterday.  Has had urinary frequency, urgency, hematuria.  She is going frequently and urgently with some dysuria and burning with urination.  Noticed some blood with urination as well.  No fevers, chills, nausea or vomiting.  Some suprapubic pain.  No significant flank pain.  No vomiting or diarrhea.  No chest pain or shortness of breath.  No history of kidney stones.  No previous abdominal surgeries.   Dysuria Associated symptoms: abdominal pain   Associated symptoms: no fever, no flank pain, no nausea and no vomiting        Home Medications Prior to Admission medications   Medication Sig Start Date End Date Taking? Authorizing Provider  apixaban  (ELIQUIS ) 5 MG TABS tablet Take 2 tablets (10mg ) twice daily for 7 days, then 1 tablet (5mg ) twice daily 03/24/21   Prosperi, Christian H, PA-C  cephALEXin  (KEFLEX ) 500 MG capsule Take 1 capsule (500 mg total) by mouth 4 (four) times daily. 10/11/23   Sofia, Leslie K, PA-C  fluconazole  (DIFLUCAN ) 150 MG tablet One tablet on day 3 and day 7 if symptoms of yeast infections 10/11/23   Sofia, Leslie K, PA-C      Allergies    Patient has no known allergies.    Review of Systems   Review of Systems  Constitutional:  Negative for activity change, appetite change and fever.  HENT:  Negative for congestion and rhinorrhea.   Respiratory:  Negative for cough, chest tightness and shortness of breath.   Cardiovascular:  Negative for chest pain.  Gastrointestinal:  Positive for abdominal pain. Negative for nausea and vomiting.  Genitourinary:   Positive for dysuria, frequency and urgency. Negative for flank pain.  Musculoskeletal:  Negative for arthralgias and joint swelling.  Skin:  Negative for rash.  Neurological:  Negative for dizziness, weakness and headaches.   all other systems are negative except as noted in the HPI and PMH.    Physical Exam Updated Vital Signs BP (!) 143/98 (BP Location: Left Arm)   Pulse 70   Temp 97.6 F (36.4 C)   Resp 20   Ht 5\' 8"  (1.727 m)   Wt 122.5 kg   SpO2 100%   BMI 41.06 kg/m  Physical Exam Vitals and nursing note reviewed.  Constitutional:      General: She is not in acute distress.    Appearance: She is well-developed.  HENT:     Head: Normocephalic and atraumatic.     Mouth/Throat:     Pharynx: No oropharyngeal exudate.  Eyes:     Conjunctiva/sclera: Conjunctivae normal.     Pupils: Pupils are equal, round, and reactive to light.  Neck:     Comments: No meningismus. Cardiovascular:     Rate and Rhythm: Normal rate and regular rhythm.     Heart sounds: Normal heart sounds. No murmur heard. Pulmonary:     Effort: Pulmonary effort is normal. No respiratory distress.     Breath sounds: Normal breath sounds.  Abdominal:     Palpations: Abdomen is soft.  Tenderness: There is abdominal tenderness. There is no guarding or rebound.     Comments: Suprapubic tenderness  Musculoskeletal:        General: No tenderness. Normal range of motion.     Cervical back: Normal range of motion and neck supple.     Comments: No CVA tenderness  Skin:    General: Skin is warm.  Neurological:     Mental Status: She is alert and oriented to person, place, and time.     Cranial Nerves: No cranial nerve deficit.     Motor: No abnormal muscle tone.     Coordination: Coordination normal.     Comments:  5/5 strength throughout. CN 2-12 intact.Equal grip strength.   Psychiatric:        Behavior: Behavior normal.     ED Results / Procedures / Treatments   Labs (all labs ordered are  listed, but only abnormal results are displayed) Labs Reviewed  URINALYSIS, ROUTINE W REFLEX MICROSCOPIC - Abnormal; Notable for the following components:      Result Value   Color, Urine RED (*)    APPearance HAZY (*)    Hgb urine dipstick LARGE (*)    Bilirubin Urine SMALL (*)    Protein, ur >=300 (*)    Nitrite POSITIVE (*)    Leukocytes,Ua TRACE (*)    All other components within normal limits  URINALYSIS, MICROSCOPIC (REFLEX) - Abnormal; Notable for the following components:   Bacteria, UA MANY (*)    All other components within normal limits  URINE CULTURE  PREGNANCY, URINE  CBC WITH DIFFERENTIAL/PLATELET  COMPREHENSIVE METABOLIC PANEL WITH GFR  LIPASE, BLOOD    EKG None  Radiology CT Renal Stone Study Result Date: 10/21/2023 CLINICAL DATA:  Flank pain and hematuria, initial encounter EXAM: CT ABDOMEN AND PELVIS WITHOUT CONTRAST TECHNIQUE: Multidetector CT imaging of the abdomen and pelvis was performed following the standard protocol without IV contrast. RADIATION DOSE REDUCTION: This exam was performed according to the departmental dose-optimization program which includes automated exposure control, adjustment of the mA and/or kV according to patient size and/or use of iterative reconstruction technique. COMPARISON:  None Available. FINDINGS: Lower chest: No acute abnormality. Hepatobiliary: No focal liver abnormality is seen. No gallstones, gallbladder wall thickening, or biliary dilatation. Pancreas: Unremarkable. No pancreatic ductal dilatation or surrounding inflammatory changes. Spleen: Normal in size without focal abnormality. Adrenals/Urinary Tract: Adrenal glands are within normal limits. Kidneys are well visualized bilaterally. No renal calculi or obstructive changes are seen. The ureters are within normal limits. The bladder is partially distended. Stomach/Bowel: The appendix is within normal limits. No obstructive or inflammatory changes of the colon are seen. Stomach and  small bowel are within normal limits. Vascular/Lymphatic: No significant vascular findings are present. No enlarged abdominal or pelvic lymph nodes. Reproductive: Uterus and bilateral adnexa are unremarkable. Other: No abdominal wall hernia or abnormality. No abdominopelvic ascites. Musculoskeletal: No acute or significant osseous findings. IMPRESSION: No renal calculi or obstructive changes. No acute abnormality noted. Electronically Signed   By: Violeta Grey M.D.   On: 10/21/2023 01:30    Procedures Procedures    Medications Ordered in ED Medications  cefTRIAXone  (ROCEPHIN ) 1 g in sodium chloride  0.9 % 100 mL IVPB (has no administration in time range)    ED Course/ Medical Decision Making/ A&P  Medical Decision Making Amount and/or Complexity of Data Reviewed Labs: ordered. Decision-making details documented in ED Course. Radiology: ordered and independent interpretation performed. Decision-making details documented in ED Course. ECG/medicine tests: ordered and independent interpretation performed. Decision-making details documented in ED Course.  Risk Prescription drug management.   UTI symptoms with treatment for same.  Stable vitals.  Adbomen soft without peritoneal signs.  hCG is negative.  Urine suspicious for infection with positive leukocytes, nitrate, bacteruria.  Will send culture.  Culture was not sent on previous visit.  Will send urine culture.  The CT scan today shows no kidney stone or other obstructive uropathy.  He is not toxic or septic  No leukocytosis.  Creatinine is normal.  Patient tolerating p.o. and voiding spontaneously.  Will send urine culture.  Will switch antibiotic to amoxicillin.  Send culture.  Consider passed kidney stone or hemorrhagic cystitis.  Return to the ED with worsening pain, fever, vomiting, unable to urinate or other concerns.        Final Clinical Impression(s) / ED Diagnoses Final diagnoses:   Hemorrhagic cystitis    Rx / DC Orders ED Discharge Orders     None         Carel Schnee, Mara [place], MD 10/21/23 [phone]

## 2023-10-22 LAB — URINE CULTURE: Culture: <10000 — AB
